# Patient Record
Sex: Male | Born: 1950
Health system: Southern US, Community
[De-identification: ages and names within clinical notes are randomized; demographics above are authoritative.]

## PROBLEM LIST (undated history)

## (undated) DIAGNOSIS — E785 Hyperlipidemia, unspecified: Secondary | ICD-10-CM

## (undated) DIAGNOSIS — J45909 Unspecified asthma, uncomplicated: Secondary | ICD-10-CM

## (undated) DIAGNOSIS — F17210 Nicotine dependence, cigarettes, uncomplicated: Secondary | ICD-10-CM

## (undated) DIAGNOSIS — J069 Acute upper respiratory infection, unspecified: Secondary | ICD-10-CM

## (undated) DIAGNOSIS — I10 Essential (primary) hypertension: Secondary | ICD-10-CM

## (undated) DIAGNOSIS — J449 Chronic obstructive pulmonary disease, unspecified: Secondary | ICD-10-CM

## (undated) DIAGNOSIS — T7840XA Allergy, unspecified, initial encounter: Secondary | ICD-10-CM

## (undated) DIAGNOSIS — H269 Unspecified cataract: Secondary | ICD-10-CM

## (undated) DIAGNOSIS — J0100 Acute maxillary sinusitis, unspecified: Secondary | ICD-10-CM

## (undated) DIAGNOSIS — M1990 Unspecified osteoarthritis, unspecified site: Secondary | ICD-10-CM

## (undated) HISTORY — DX: Allergy, unspecified, initial encounter: T78.40XA

## (undated) HISTORY — PX: CATARACT EXTRACTION W/ INTRAOCULAR LENS IMPLANT: SHX1309

## (undated) HISTORY — DX: Unspecified osteoarthritis, unspecified site: M19.90

## (undated) HISTORY — PX: SHOULDER ARTHROSCOPY W/ ACROMIAL REPAIR: SUR94

## (undated) HISTORY — DX: Chronic obstructive pulmonary disease, unspecified: J44.9

## (undated) HISTORY — DX: Unspecified asthma, uncomplicated: J45.909

## (undated) HISTORY — DX: Nicotine dependence, cigarettes, uncomplicated: F17.210

## (undated) HISTORY — PX: POLYPECTOMY: SHX149

## (undated) HISTORY — DX: Unspecified cataract: H26.9

## (undated) HISTORY — DX: Hyperlipidemia, unspecified: E78.5

## (undated) HISTORY — DX: Acute maxillary sinusitis, unspecified: J01.00

## (undated) HISTORY — DX: Acute upper respiratory infection, unspecified: J06.9

## (undated) HISTORY — DX: Essential (primary) hypertension: I10

---

## 1986-06-10 HISTORY — PX: ELBOW SURGERY: SHX618

## 1986-06-10 HISTORY — PX: OTHER SURGICAL HISTORY: SHX169

## 2000-03-18 ENCOUNTER — Emergency Department (HOSPITAL_COMMUNITY): Admission: EM | Admit: 2000-03-18 | Discharge: 2000-03-18 | Payer: Self-pay | Admitting: Emergency Medicine

## 2002-07-06 LAB — HM COLONOSCOPY

## 2006-02-27 ENCOUNTER — Ambulatory Visit: Payer: Self-pay | Admitting: Internal Medicine

## 2006-03-10 DIAGNOSIS — C4491 Basal cell carcinoma of skin, unspecified: Secondary | ICD-10-CM

## 2006-03-10 HISTORY — DX: Basal cell carcinoma of skin, unspecified: C44.91

## 2007-02-24 ENCOUNTER — Ambulatory Visit: Payer: Self-pay | Admitting: Internal Medicine

## 2007-02-24 DIAGNOSIS — J0100 Acute maxillary sinusitis, unspecified: Secondary | ICD-10-CM

## 2007-02-24 HISTORY — DX: Acute maxillary sinusitis, unspecified: J01.00

## 2007-05-28 ENCOUNTER — Ambulatory Visit: Payer: Self-pay | Admitting: Internal Medicine

## 2007-05-28 DIAGNOSIS — J069 Acute upper respiratory infection, unspecified: Secondary | ICD-10-CM

## 2007-05-28 HISTORY — DX: Acute upper respiratory infection, unspecified: J06.9

## 2007-07-20 ENCOUNTER — Emergency Department (HOSPITAL_COMMUNITY): Admission: EM | Admit: 2007-07-20 | Discharge: 2007-07-20 | Payer: Self-pay | Admitting: Emergency Medicine

## 2007-12-21 ENCOUNTER — Ambulatory Visit: Payer: Self-pay | Admitting: Internal Medicine

## 2007-12-21 LAB — CONVERTED CEMR LAB
AST: 26 units/L (ref 0–37)
Albumin: 4 g/dL (ref 3.5–5.2)
Alkaline Phosphatase: 56 units/L (ref 39–117)
BUN: 18 mg/dL (ref 6–23)
Bilirubin, Direct: 0.1 mg/dL (ref 0.0–0.3)
Chloride: 109 meq/L (ref 96–112)
Cholesterol: 238 mg/dL (ref 0–200)
Eosinophils Absolute: 0.2 10*3/uL (ref 0.0–0.7)
Eosinophils Relative: 3.5 % (ref 0.0–5.0)
GFR calc Af Amer: 111 mL/min
GFR calc non Af Amer: 92 mL/min
HDL: 64.5 mg/dL (ref >39.0)
MCV: 96.9 fL (ref 78.0–100.0)
Monocytes Absolute: 0.5 10*3/uL (ref 0.1–1.0)
Monocytes Relative: 7.9 % (ref 3.0–12.0)
Neutrophils Relative %: 61.2 % (ref 43.0–77.0)
Platelets: 193 10*3/uL (ref 150–400)
Potassium: 4.5 meq/L (ref 3.5–5.1)
RDW: 12.8 % (ref 11.5–14.6)
Sodium: 144 meq/L (ref 135–145)
TSH: 0.9 microintl units/mL (ref 0.35–5.50)
Total Bilirubin: 0.9 mg/dL (ref 0.3–1.2)
Total CHOL/HDL Ratio: 3.7
Triglycerides: 62 mg/dL (ref 0–149)
WBC: 6.8 10*3/uL (ref 4.5–10.5)

## 2007-12-22 ENCOUNTER — Ambulatory Visit: Payer: Self-pay | Admitting: Internal Medicine

## 2008-03-07 ENCOUNTER — Telehealth: Payer: Self-pay | Admitting: Internal Medicine

## 2008-08-11 ENCOUNTER — Telehealth: Payer: Self-pay | Admitting: Internal Medicine

## 2010-03-26 DIAGNOSIS — C4491 Basal cell carcinoma of skin, unspecified: Secondary | ICD-10-CM

## 2010-03-26 DIAGNOSIS — D229 Melanocytic nevi, unspecified: Secondary | ICD-10-CM

## 2010-03-26 HISTORY — DX: Basal cell carcinoma of skin, unspecified: C44.91

## 2010-03-26 HISTORY — DX: Melanocytic nevi, unspecified: D22.9

## 2010-12-05 ENCOUNTER — Ambulatory Visit (INDEPENDENT_AMBULATORY_CARE_PROVIDER_SITE_OTHER): Payer: BC Managed Care – PPO | Admitting: Family Medicine

## 2010-12-05 ENCOUNTER — Encounter: Payer: Self-pay | Admitting: Family Medicine

## 2010-12-05 VITALS — BP 178/98 | Temp 98.6°F | Ht 71.25 in | Wt 162.0 lb

## 2010-12-05 DIAGNOSIS — I10 Essential (primary) hypertension: Secondary | ICD-10-CM

## 2010-12-05 DIAGNOSIS — W57XXXA Bitten or stung by nonvenomous insect and other nonvenomous arthropods, initial encounter: Secondary | ICD-10-CM

## 2010-12-05 DIAGNOSIS — T148XXA Other injury of unspecified body region, initial encounter: Secondary | ICD-10-CM

## 2010-12-05 MED ORDER — LOSARTAN POTASSIUM 50 MG PO TABS: 50.0000 mg | ORAL_TABLET | Freq: Every day | ORAL | Status: DC

## 2010-12-05 NOTE — Progress Notes (Signed)
  Subjective:    Patient ID: Russell Cox, male    DOB: 02/02/1951, 60 y.o.   MRN: [id number]  HPI Patient seen for the following  Recent tick bite 2 days ago. By description, probable deer tick. Site of the bite left lower abdomen. Minimal surrounding erythema. No pain or itching. Question of retained tick part. Denies fever, chills, headache, or other rash. No arthralgias.  History of elevated blood pressure but several recent readings. Recently at work 160s systolic. Generally feels well no headaches or dizziness. No chest pains. No peripheral edema. No nonsteroidal use. No regular alcohol use. He has no history of CAD or peripheral vascular disease. Currently takes no regular medications   Review of Systems  Constitutional: Negative for fever, chills and fatigue.  Eyes: Negative for visual disturbance.  Respiratory: Negative for cough, chest tightness and shortness of breath.   Cardiovascular: Negative for chest pain, palpitations and leg swelling.  Skin: Positive for rash.  Neurological: Negative for dizziness, syncope, weakness, light-headedness and headaches.  Hematological: Negative for adenopathy. Does not bruise/bleed easily.       Objective:   Physical Exam  Constitutional: He is oriented to person, place, and time. He appears well-developed and well-nourished. No distress.  HENT:  Head: Normocephalic and atraumatic.  Right Ear: External ear normal.  Left Ear: External ear normal.  Mouth/Throat: Oropharynx is clear and moist.  Eyes: Conjunctivae and EOM are normal. Pupils are equal, round, and reactive to light.  Neck: Normal range of motion. Neck supple. No thyromegaly present.  Cardiovascular: Normal rate, regular rhythm and normal heart sounds.   No murmur heard. Pulmonary/Chest: No respiratory distress. He has no wheezes. He has no rales.  Musculoskeletal: He exhibits no edema.  Lymphadenopathy:    He has no cervical adenopathy.  Neurological: He is alert and  oriented to person, place, and time. He displays normal reflexes. No cranial nerve deficit.  Skin: Rash noted.       Oval shaped area of rash 0.5 x 1 cm left lower abdomen. They're edematous and minimally raised. A magnification no definite tick parts identified. No pustules  Psychiatric: He has a normal mood and affect.          Assessment & Plan:  #1 tick bite. Local rash probably allergy related. No evidence for erythema chronicum migrans. No definite tick parts retained. Observe for now #2 elevated blood pressure with repeat being 178/98 with other elevated readings over 160 systolic over recent weeks. Start losartan 50 mg daily and schedule followup with primary in one month to reassess

## 2011-03-01 LAB — CBC
HCT: 45.2
Hemoglobin: 16
MCHC: 35.5
MCV: 92
Platelets: 216
RBC: 4.92
RDW: 13.4
WBC: 7.4

## 2011-03-01 LAB — BASIC METABOLIC PANEL
BUN: 9
Chloride: 103
GFR calc Af Amer: >60
GFR calc non Af Amer: >60
Potassium: 4.1
Sodium: 136

## 2011-03-01 LAB — BASIC METABOLIC PANEL WITH GFR
CO2: 26
Calcium: 9.3
Creatinine, Ser: 0.95
Glucose, Bld: 117 — ABNORMAL HIGH

## 2011-03-01 LAB — POCT CARDIAC MARKERS
CKMB, poc: 1.3
CKMB, poc: 1.6
Myoglobin, poc: 58.8
Myoglobin, poc: 84.5
Operator id: 3067
Operator id: 4708
Troponin i, poc: <0.05
Troponin i, poc: <0.05

## 2011-03-01 LAB — PROTIME-INR
INR: 1
Prothrombin Time: 13.3

## 2011-03-01 LAB — DIFFERENTIAL
Basophils Absolute: 0.1
Basophils Relative: 1
Eosinophils Absolute: 0.2
Eosinophils Relative: 3
Lymphocytes Relative: 18
Lymphs Abs: 1.4
Monocytes Absolute: 0.6
Monocytes Relative: 8
Neutro Abs: 5.2
Neutrophils Relative %: 70

## 2011-03-01 LAB — APTT: aPTT: 28

## 2011-03-07 ENCOUNTER — Other Ambulatory Visit (INDEPENDENT_AMBULATORY_CARE_PROVIDER_SITE_OTHER): Payer: BC Managed Care – PPO

## 2011-03-07 DIAGNOSIS — Z0000 Encounter for general adult medical examination without abnormal findings: Secondary | ICD-10-CM

## 2011-03-07 LAB — CBC WITH DIFFERENTIAL/PLATELET
Basophils Absolute: 0 10*3/uL (ref 0.0–0.1)
Eosinophils Absolute: 0.3 10*3/uL (ref 0.0–0.7)
HCT: 47 % (ref 39.0–52.0)
Hemoglobin: 15.8 g/dL (ref 13.0–17.0)
Lymphs Abs: 1.6 10*3/uL (ref 0.7–4.0)
MCHC: 33.5 g/dL (ref 30.0–36.0)
Neutro Abs: 4.6 10*3/uL (ref 1.4–7.7)
Platelets: 221 10*3/uL (ref 150.0–400.0)
RDW: 13.6 % (ref 11.5–14.6)

## 2011-03-07 LAB — POCT URINALYSIS DIPSTICK
Bilirubin, UA: NEGATIVE
Blood, UA: NEGATIVE
Ketones, UA: NEGATIVE
Leukocytes, UA: NEGATIVE
Spec Grav, UA: 1.02
pH, UA: 7

## 2011-03-08 LAB — LIPID PANEL
Cholesterol: 271 mg/dL — ABNORMAL HIGH (ref 0–200)
HDL: 73.7 mg/dL (ref >39.00)
Triglycerides: 60 mg/dL (ref 0.0–149.0)
VLDL: 12 mg/dL (ref 0.0–40.0)

## 2011-03-08 LAB — BASIC METABOLIC PANEL
BUN: 11 mg/dL (ref 6–23)
CO2: 26 mEq/L (ref 19–32)
Calcium: 9.3 mg/dL (ref 8.4–10.5)
Glucose, Bld: 99 mg/dL (ref 70–99)
Potassium: 5.1 mEq/L (ref 3.5–5.1)
Sodium: 141 mEq/L (ref 135–145)

## 2011-03-08 LAB — HEPATIC FUNCTION PANEL: Albumin: 4.3 g/dL (ref 3.5–5.2)

## 2011-03-25 ENCOUNTER — Encounter: Payer: Self-pay | Admitting: Internal Medicine

## 2011-03-25 ENCOUNTER — Ambulatory Visit (INDEPENDENT_AMBULATORY_CARE_PROVIDER_SITE_OTHER): Payer: BC Managed Care – PPO | Admitting: Internal Medicine

## 2011-03-25 DIAGNOSIS — F17200 Nicotine dependence, unspecified, uncomplicated: Secondary | ICD-10-CM

## 2011-03-25 DIAGNOSIS — Z720 Tobacco use: Secondary | ICD-10-CM

## 2011-03-25 DIAGNOSIS — E785 Hyperlipidemia, unspecified: Secondary | ICD-10-CM

## 2011-03-25 DIAGNOSIS — I10 Essential (primary) hypertension: Secondary | ICD-10-CM

## 2011-03-25 DIAGNOSIS — Z0000 Encounter for general adult medical examination without abnormal findings: Secondary | ICD-10-CM

## 2011-03-25 DIAGNOSIS — Z23 Encounter for immunization: Secondary | ICD-10-CM

## 2011-03-25 MED ORDER — SILDENAFIL CITRATE 100 MG PO TABS: 100.0000 mg | ORAL_TABLET | ORAL | Status: DC | PRN

## 2011-03-25 MED ORDER — LOSARTAN POTASSIUM 50 MG PO TABS: 50.0000 mg | ORAL_TABLET | Freq: Every day | ORAL | Status: DC

## 2011-03-25 NOTE — Patient Instructions (Signed)
Limit your sodium (Salt) intake    It is important that you exercise regularly, at least 20 minutes 3 to 4 times per week.  If you develop chest pain or shortness of breath seek  medical attention.  Smoking tobacco is very bad for your health. You should stop smoking immediately.  Return in one year for follow-up  

## 2011-03-25 NOTE — Progress Notes (Signed)
Subjective:    Patient ID: Russell Cox, male    DOB: 08-21-1950, 60 y.o.   MRN: [id number]  HPI  60 year old patient who is in today for a preventive health examination. He has treated hypertension and a history of ongoing tobacco use he has cut his smoking consumption down to one half pack of cigarettes per daily recent lipid profile reviewed and risks versus benefits of statin therapy discussed. He is asymptomatic. Denies any cardiopulmonary complaints  Current Allergies:  ! PENICILLIN G POTASSIUM (PENICILLIN G POTASSIUM)   Past Medical History:  Reviewed history from 02/24/2007 and no changes required:  childhood asthma  ongoing tobacco use   Past Surgical History:  Reviewed history from 02/24/2007 and no changes required:  surgery for a tennis elbow in 1988  hemorrhoidectomy 88  colonoscopy. 2004   Family History:  Reviewed history from 02/24/2007 and no changes required:  father died age 74, congestive heart failure. Mother died age 27. One brother one sister, brother with hypertension and bladder cancer father may have had thyroid cancer  mother had cancer, unclear type   Social History:  Reviewed history from 02/24/2007 and no changes required:  Married  Current Smoker  Regular exercise-no  Risk Factors:  Counseled to quit/cut down tobacco use: yes  Exercise: no     Review of Systems  Constitutional: Negative for fever, chills, activity change, appetite change and fatigue.  HENT: Negative for hearing loss, ear pain, congestion, rhinorrhea, sneezing, mouth sores, trouble swallowing, neck pain, neck stiffness, dental problem, voice change, sinus pressure and tinnitus.   Eyes: Negative for photophobia, pain, redness and visual disturbance.  Respiratory: Negative for apnea, cough, choking, chest tightness, shortness of breath and wheezing.   Cardiovascular: Negative for chest pain, palpitations and leg swelling.  Gastrointestinal: Negative for nausea, vomiting, abdominal  pain, diarrhea, constipation, blood in stool, abdominal distention, anal bleeding and rectal pain.  Genitourinary: Negative for dysuria, urgency, frequency, hematuria, flank pain, decreased urine volume, discharge, penile swelling, scrotal swelling, difficulty urinating, genital sores and testicular pain.  Musculoskeletal: Negative for myalgias, back pain, joint swelling, arthralgias and gait problem.  Skin: Negative for color change, rash and wound.  Neurological: Negative for dizziness, tremors, seizures, syncope, facial asymmetry, speech difficulty, weakness, light-headedness, numbness and headaches.  Hematological: Negative for adenopathy. Does not bruise/bleed easily.  Psychiatric/Behavioral: Negative for suicidal ideas, hallucinations, behavioral problems, confusion, sleep disturbance, self-injury, dysphoric mood, decreased concentration and agitation. The patient is not nervous/anxious.        Objective:   Physical Exam  Constitutional: He appears well-developed and well-nourished.  HENT:  Head: Normocephalic and atraumatic.  Right Ear: External ear normal.  Left Ear: External ear normal.  Nose: Nose normal.  Mouth/Throat: Oropharynx is clear and moist.  Eyes: Conjunctivae and EOM are normal. Pupils are equal, round, and reactive to light. No scleral icterus.  Neck: Normal range of motion. Neck supple. No JVD present. No thyromegaly present.  Cardiovascular: Regular rhythm, normal heart sounds and intact distal pulses.  Exam reveals no gallop and no friction rub.   No murmur heard. Pulmonary/Chest: Effort normal and breath sounds normal. He exhibits no tenderness.  Abdominal: Soft. Bowel sounds are normal. He exhibits no distension and no mass. There is no tenderness.       Bilateral femoral bruits right greater than the left  Genitourinary: Prostate normal and penis normal.  Musculoskeletal: Normal range of motion. He exhibits no edema and no tenderness.  Lymphadenopathy:    He  has no  cervical adenopathy.  Neurological: He is alert. He has normal reflexes. No cranial nerve deficit. Coordination normal.  Skin: Skin is warm and dry. No rash noted.  Psychiatric: He has a normal mood and affect. His behavior is normal.          Assessment & Plan:   Hypertension well controlled Dyslipidemia with a high HDL. Statin therapy discussed Ongoing tobacco use  Total cessation of smoking encouraged;  recheck in one year  Patient wishes to try the diet and follow the lipid panel in 4 months. We'll reconsider statin therapy at that time

## 2011-07-22 ENCOUNTER — Other Ambulatory Visit (INDEPENDENT_AMBULATORY_CARE_PROVIDER_SITE_OTHER): Payer: BC Managed Care – PPO

## 2011-07-22 DIAGNOSIS — E785 Hyperlipidemia, unspecified: Secondary | ICD-10-CM

## 2011-07-22 NOTE — Progress Notes (Unsigned)
  Subjective:    Patient ID: Russell Cox, male    DOB: [birth year], 61 y.o.   MRN: [id number]  HPI    Review of Systems     Objective:   Physical Exam        Assessment & Plan:

## 2011-07-23 LAB — LIPID PANEL
HDL: 80.3 mg/dL (ref >39.00)
Triglycerides: 100 mg/dL (ref 0.0–149.0)
VLDL: 20 mg/dL (ref 0.0–40.0)

## 2011-07-23 LAB — LDL CHOLESTEROL, DIRECT: Direct LDL: 160.7 mg/dL

## 2011-07-24 NOTE — Progress Notes (Signed)
Quick Note:  spke with pt- informed of labs and dr. Vernon Prey instructions - will f/u in 6 mos at cpx ______

## 2011-07-29 ENCOUNTER — Ambulatory Visit: Payer: BC Managed Care – PPO | Admitting: Internal Medicine

## 2012-03-19 ENCOUNTER — Other Ambulatory Visit: Payer: BC Managed Care – PPO

## 2012-03-26 ENCOUNTER — Encounter: Payer: BC Managed Care – PPO | Admitting: Internal Medicine

## 2012-04-09 ENCOUNTER — Other Ambulatory Visit (INDEPENDENT_AMBULATORY_CARE_PROVIDER_SITE_OTHER): Payer: BC Managed Care – PPO

## 2012-04-09 DIAGNOSIS — Z0000 Encounter for general adult medical examination without abnormal findings: Secondary | ICD-10-CM

## 2012-04-09 LAB — LIPID PANEL
HDL: 57.2 mg/dL (ref >39.00)
Triglycerides: 103 mg/dL (ref 0.0–149.0)

## 2012-04-09 LAB — CBC WITH DIFFERENTIAL/PLATELET
Basophils Absolute: 0 10*3/uL (ref 0.0–0.1)
HCT: 45.3 % (ref 39.0–52.0)
Lymphs Abs: 1.7 10*3/uL (ref 0.7–4.0)
Monocytes Absolute: 0.7 10*3/uL (ref 0.1–1.0)
Monocytes Relative: 8.3 % (ref 3.0–12.0)
Platelets: 256 10*3/uL (ref 150.0–400.0)
RDW: 12.9 % (ref 11.5–14.6)

## 2012-04-09 LAB — POCT URINALYSIS DIPSTICK
Blood, UA: NEGATIVE
Ketones, UA: NEGATIVE
Protein, UA: NEGATIVE
Spec Grav, UA: 1.02
pH, UA: 6

## 2012-04-09 LAB — HEPATIC FUNCTION PANEL
AST: 22 U/L (ref 0–37)
Albumin: 3.6 g/dL (ref 3.5–5.2)
Alkaline Phosphatase: 60 U/L (ref 39–117)
Total Protein: 7 g/dL (ref 6.0–8.3)

## 2012-04-09 LAB — BASIC METABOLIC PANEL
CO2: 25 mEq/L (ref 19–32)
Calcium: 9.6 mg/dL (ref 8.4–10.5)
GFR: 89.47 mL/min (ref >60.00)
Sodium: 138 mEq/L (ref 135–145)

## 2012-04-20 ENCOUNTER — Encounter: Payer: Self-pay | Admitting: Internal Medicine

## 2012-04-20 ENCOUNTER — Ambulatory Visit (INDEPENDENT_AMBULATORY_CARE_PROVIDER_SITE_OTHER): Payer: BC Managed Care – PPO | Admitting: Internal Medicine

## 2012-04-20 VITALS — BP 150/82 | HR 95 | Temp 97.8°F | Resp 16 | Ht 71.75 in | Wt 168.0 lb

## 2012-04-20 DIAGNOSIS — Z720 Tobacco use: Secondary | ICD-10-CM

## 2012-04-20 DIAGNOSIS — E785 Hyperlipidemia, unspecified: Secondary | ICD-10-CM

## 2012-04-20 DIAGNOSIS — I10 Essential (primary) hypertension: Secondary | ICD-10-CM

## 2012-04-20 DIAGNOSIS — Z23 Encounter for immunization: Secondary | ICD-10-CM

## 2012-04-20 DIAGNOSIS — F17200 Nicotine dependence, unspecified, uncomplicated: Secondary | ICD-10-CM

## 2012-04-20 DIAGNOSIS — Z0000 Encounter for general adult medical examination without abnormal findings: Secondary | ICD-10-CM

## 2012-04-20 MED ORDER — SILDENAFIL CITRATE 100 MG PO TABS: 100.0000 mg | ORAL_TABLET | ORAL | Status: DC | PRN

## 2012-04-20 MED ORDER — LOSARTAN POTASSIUM 50 MG PO TABS: 50.0000 mg | ORAL_TABLET | Freq: Every day | ORAL | Status: DC

## 2012-04-20 MED ORDER — ATORVASTATIN CALCIUM 40 MG PO TABS: 40.0000 mg | ORAL_TABLET | Freq: Every day | ORAL | Status: DC

## 2012-04-20 NOTE — Patient Instructions (Addendum)
Smoking tobacco is very bad for your health. You should stop smoking immediately.    It is important that you exercise regularly, at least 20 minutes 3 to 4 times per week.  If you develop chest pain or shortness of breath seek  medical attention.  Please check your blood pressure on a regular basis.  If it is consistently greater than 150/90, please make an office appointment.  Return in one year for follow-up   Schedule your colonoscopy to help detect colon cancer.

## 2012-04-20 NOTE — Progress Notes (Signed)
Subjective:    Patient ID: Russell Cox, male    DOB: 1951-04-08, 61 y.o.   MRN: [id number]  HPI  61 year old patient who is seen today for a preventive health examination. Cardiovascular risk factors  include hypertension dyslipidemia and ongoing tobacco use.  Past Medical History  Diagnosis Date  . SINUSITIS, ACUTE MAXILLARY 02/24/2007  . URI 05/28/2007  . Cigarette smoker     History   Social History  . Marital Status: Married    Spouse Name: N/A    Number of Children: N/A  . Years of Education: N/A   Occupational History  . Not on file.   Social History Main Topics  . Smoking status: Current Every Day Smoker -- 1.0 packs/day for 40 years    Types: Cigarettes  . Smokeless tobacco: Never Used  . Alcohol Use: Not on file  . Drug Use: Not on file  . Sexually Active: Not on file   Other Topics Concern  . Not on file   Social History Narrative  . No narrative on file    Past Surgical History  Procedure Date  . Elbow surgery 1988    tennis  . Hemmorrhoidectomy 1988    Family History  Problem Relation Age of Onset  . Cancer Mother     unknown what type  . Heart disease Father   . Cancer Father     unclear, maybe thyroid  . Hypertension Sister   . Hypertension Brother     Allergies  Allergen Reactions  . Penicillins     Current Outpatient Prescriptions on File Prior to Visit  Medication Sig Dispense Refill  . losartan (COZAAR) 50 MG tablet Take 1 tablet (50 mg total) by mouth daily.  90 tablet  6  . sildenafil (VIAGRA) 100 MG tablet Take 1 tablet (100 mg total) by mouth as needed for erectile dysfunction.  20 tablet  1    BP 150/82  Pulse 95  Temp 97.8 F (36.6 C) (Oral)  Resp 16  Ht 5' 11.75" (1.822 m)  Wt 168 lb (76.204 kg)  BMI 22.94 kg/m2  SpO2 98%    Review of Systems  Constitutional: Negative for fever, chills, activity change, appetite change and fatigue.  HENT: Negative for hearing loss, ear pain, congestion, rhinorrhea, sneezing,  mouth sores, trouble swallowing, neck pain, neck stiffness, dental problem, voice change, sinus pressure and tinnitus.   Eyes: Negative for photophobia, pain, redness and visual disturbance.  Respiratory: Negative for apnea, cough, choking, chest tightness, shortness of breath and wheezing.   Cardiovascular: Negative for chest pain, palpitations and leg swelling.  Gastrointestinal: Negative for nausea, vomiting, abdominal pain, diarrhea, constipation, blood in stool, abdominal distention, anal bleeding and rectal pain.  Genitourinary: Negative for dysuria, urgency, frequency, hematuria, flank pain, decreased urine volume, discharge, penile swelling, scrotal swelling, difficulty urinating, genital sores and testicular pain.  Musculoskeletal: Negative for myalgias, back pain, joint swelling, arthralgias and gait problem.  Skin: Negative for color change, rash and wound.  Neurological: Negative for dizziness, tremors, seizures, syncope, facial asymmetry, speech difficulty, weakness, light-headedness, numbness and headaches.  Hematological: Negative for adenopathy. Does not bruise/bleed easily.  Psychiatric/Behavioral: Negative for suicidal ideas, hallucinations, behavioral problems, confusion, sleep disturbance, self-injury, dysphoric mood, decreased concentration and agitation. The patient is not nervous/anxious.        Objective:   Physical Exam  Constitutional: He appears well-developed and well-nourished.  HENT:  Head: Normocephalic and atraumatic.  Right Ear: External ear normal.  Left Ear: External ear normal.  Nose: Nose normal.  Mouth/Throat: Oropharynx is clear and moist.  Eyes: Conjunctivae normal and EOM are normal. Pupils are equal, round, and reactive to light. No scleral icterus.  Neck: Normal range of motion. Neck supple. No JVD present. No thyromegaly present.       Faint right supraclavicular bruit  Cardiovascular: Normal rate, regular rhythm, normal heart sounds and intact  distal pulses.  Exam reveals no gallop and no friction rub.   No murmur heard.      Bilateral femoral bruits  Pulmonary/Chest: Effort normal and breath sounds normal. He exhibits no tenderness.  Abdominal: Soft. Bowel sounds are normal. He exhibits no distension and no mass. There is no tenderness.  Genitourinary: Prostate normal and penis normal. Guaiac negative stool.  Musculoskeletal: Normal range of motion. He exhibits no edema and no tenderness.  Lymphadenopathy:    He has no cervical adenopathy.  Neurological: He is alert. He has normal reflexes. No cranial nerve deficit. Coordination normal.  Skin: Skin is warm and dry. No rash noted.  Psychiatric: He has a normal mood and affect. His behavior is normal.          Assessment & Plan:   Preventive health  HTN HLD- start atorvastain  F/u colonoscopy

## 2012-04-22 ENCOUNTER — Encounter: Payer: Self-pay | Admitting: Gastroenterology

## 2012-04-27 ENCOUNTER — Ambulatory Visit (AMBULATORY_SURGERY_CENTER): Payer: BC Managed Care – PPO

## 2012-04-27 VITALS — Ht 71.0 in | Wt 170.0 lb

## 2012-04-27 DIAGNOSIS — Z1211 Encounter for screening for malignant neoplasm of colon: Secondary | ICD-10-CM

## 2012-04-27 MED ORDER — MOVIPREP 100 G PO SOLR: 1.0000 | Freq: Once | ORAL | Status: DC

## 2012-05-18 ENCOUNTER — Encounter: Payer: Self-pay | Admitting: Gastroenterology

## 2012-05-18 ENCOUNTER — Ambulatory Visit (AMBULATORY_SURGERY_CENTER): Payer: BC Managed Care – PPO | Admitting: Gastroenterology

## 2012-05-18 VITALS — BP 130/71 | HR 69 | Temp 96.8°F | Resp 9 | Ht 71.0 in | Wt 170.0 lb

## 2012-05-18 DIAGNOSIS — D126 Benign neoplasm of colon, unspecified: Secondary | ICD-10-CM

## 2012-05-18 DIAGNOSIS — Z1211 Encounter for screening for malignant neoplasm of colon: Secondary | ICD-10-CM

## 2012-05-18 MED ORDER — SODIUM CHLORIDE 0.9 % IV SOLN: 500.0000 mL | INTRAVENOUS | Status: DC

## 2012-05-18 NOTE — Patient Instructions (Addendum)
YOU HAD AN ENDOSCOPIC PROCEDURE TODAY AT THE [facility]: Refer to the procedure report that was given to you for any specific questions about what was found during the examination.  If the procedure report does not answer your questions, please call your gastroenterologist to clarify.  If you requested that your care partner not be given the details of your procedure findings, then the procedure report has been included in a sealed envelope for you to review at your convenience later.  YOU SHOULD EXPECT: Some feelings of bloating in the abdomen. Passage of more gas than usual.  Walking can help get rid of the air that was put into your GI tract during the procedure and reduce the bloating. If you had a lower endoscopy (such as a colonoscopy or flexible sigmoidoscopy) you may notice spotting of blood in your stool or on the toilet paper. If you underwent a bowel prep for your procedure, then you may not have a normal bowel movement for a few days.  DIET: Your first meal following the procedure should be a light meal and then it is ok to progress to your normal diet.  A half-sandwich or bowl of soup is an example of a good first meal.  Heavy or fried foods are harder to digest and may make you feel nauseous or bloated.  Likewise meals heavy in dairy and vegetables can cause extra gas to form and this can also increase the bloating.  Drink plenty of fluids but you should avoid alcoholic beverages for 24 hours.  ACTIVITY: Your care partner should take you home directly after the procedure.  You should plan to take it easy, moving slowly for the rest of the day.  You can resume normal activity the day after the procedure however you should NOT DRIVE or use heavy machinery for 24 hours (because of the sedation medicines used during the test).    SYMPTOMS TO REPORT IMMEDIATELY: A gastroenterologist can be reached at any hour.  During normal business hours, 8:30 AM to 5:00 PM Monday through Friday,  call [phone].  After hours and on weekends, please call the GI answering service at [phone] who will take a message and have the physician on call contact you.   Following lower endoscopy (colonoscopy or flexible sigmoidoscopy):  Excessive amounts of blood in the stool  Significant tenderness or worsening of abdominal pains  Swelling of the abdomen that is new, acute  Fever of 100F or higher  FOLLOW UP: If any biopsies were taken you will be contacted by phone or by letter within the next 1-3 weeks.  Call your gastroenterologist if you have not heard about the biopsies in 3 weeks.  Our staff will call the home number listed on your records the next business day following your procedure to check on you and address any questions or concerns that you may have at that time regarding the information given to you following your procedure. This is a courtesy call and so if there is no answer at the home number and we have not heard from you through the emergency physician on call, we will assume that you have returned to your regular daily activities without incident.  SIGNATURES/CONFIDENTIALITY: You and/or your care partner have signed paperwork which will be entered into your electronic medical record.  These signatures attest to the fact that that the information above on your After Visit Summary has been reviewed and is understood.  Full responsibility of the confidentiality of this   discharge information lies with you and/or your care-partner.   Thank-you for choosing us for your healthcare needs. 

## 2012-05-18 NOTE — Progress Notes (Signed)
Patient did not have preoperative order for IV antibiotic SSI prophylaxis. (G8918)  Patient did not experience any of the following events: a burn prior to discharge; a fall within the facility; wrong site/side/patient/procedure/implant event; or a hospital transfer or hospital admission upon discharge from the facility. (G8907)  

## 2012-05-18 NOTE — Progress Notes (Signed)
Called to room to assist during endoscopic procedure.  Patient ID and intended procedure confirmed with present staff. Received instructions for my participation in the procedure from the performing physician.  

## 2012-05-18 NOTE — Op Note (Signed)
 [facility] [address] [place], [address]   COLONOSCOPY PROCEDURE REPORT  PATIENT: Russell Cox, Russell Cox  MR#: [id number] BIRTHDATE: Mar 11, 1951 , 60  yrs. old GENDER: Male ENDOSCOPIST: Meryl Dare, MD, [facility] PROCEDURE DATE:  05/18/2012 PROCEDURE:   Colonoscopy with snare polypectomy and Colonoscopy with biopsy ASA CLASS:   Class II INDICATIONS:average risk screening and mother with colon cancer at age 63. MEDICATIONS: MAC sedation, administered by CRNA and propofol (Diprivan) 350mg  IV DESCRIPTION OF PROCEDURE:   After the risks benefits and alternatives of the procedure were thoroughly explained, informed consent was obtained.  A digital rectal exam revealed no abnormalities of the rectum.   The LB [id number]  endoscope was introduced through the anus and advanced to the cecum, which was identified by both the appendix and ileocecal valve. No adverse events experienced.   The quality of the prep was excellent, using MoviPrep  The instrument was then slowly withdrawn as the colon was fully examined.  COLON FINDINGS: Four sessile polyps measuring 5-6 mm in size were found in the sigmoid colon and descending colon.  A polypectomy was performed with a cold snare.  The resection was complete and the polyp tissue was completely retrieved.   Four sessile polyps measuring 3-4 mm in size were found in the rectum.  A polypectomy was performed with cold forceps.  The resection was complete and the polyp tissue was completely retrieved. The colon was otherwise normal. There was no diverticulosis, inflammation, polyps or cancers unless previously stated.  Retroflexed views revealed no abnormalities. The time to cecum=3 minutes 00 seconds.  Withdrawal time=12 minutes 52 seconds.  The scope was withdrawn and the procedure completed.  COMPLICATIONS: There were no complications.  ENDOSCOPIC IMPRESSION: 1.   Four sessile polyps measuring 5-6 mm in the sigmoid  and descending colon; polypectomy performed with a cold snare 2.   Four sessile polyps measuring 3-4 mm in the rectum; polypectomy performed with cold forceps  RECOMMENDATIONS: 1.  Await pathology results 2.  Repeat Colonoscopy in 3 years if 3 or more polyps are adenomatous, 5 years if 1-2 adenomatous, otherwise 10 years.   eSigned:  Meryl Dare, MD, [facility] 05/18/2012 2:28 PM

## 2012-05-19 ENCOUNTER — Telehealth: Payer: Self-pay | Admitting: *Deleted

## 2012-05-19 NOTE — Telephone Encounter (Signed)
  Follow up Call-  Call back number 05/18/2012  Post procedure Call Back phone  # [phone]  Permission to leave phone message Yes     Patient questions:  Do you have a fever, pain , or abdominal swelling? no Pain Score  0 *  Have you tolerated food without any problems? yes  Have you been able to return to your normal activities? yes  Do you have any questions about your discharge instructions: Diet   no Medications  no Follow up visit  no  Do you have questions or concerns about your Care? no  Actions: * If pain score is 4 or above: No action needed, pain <4.

## 2012-05-24 ENCOUNTER — Encounter: Payer: Self-pay | Admitting: Gastroenterology

## 2012-07-29 ENCOUNTER — Other Ambulatory Visit: Payer: Self-pay | Admitting: Dermatology

## 2013-03-31 ENCOUNTER — Encounter: Payer: Self-pay | Admitting: Family Medicine

## 2013-03-31 ENCOUNTER — Ambulatory Visit (INDEPENDENT_AMBULATORY_CARE_PROVIDER_SITE_OTHER): Payer: 59 | Admitting: Family Medicine

## 2013-03-31 VITALS — BP 148/80 | HR 103 | Temp 97.9°F | Wt 158.0 lb

## 2013-03-31 DIAGNOSIS — M26609 Unspecified temporomandibular joint disorder, unspecified side: Secondary | ICD-10-CM

## 2013-03-31 NOTE — Progress Notes (Signed)
  Subjective:    Patient ID: Russell Cox, male    DOB: [birth year], 62 y.o.   MRN: [id number]  HPI Here for one month of intermittent pain in the left ear and left jaw. No pain on chewing. No sinus sx of ST. He admits to grinding his teeth at night.    Review of Systems  Constitutional: Negative.   HENT: Positive for ear pain. Negative for sinus pressure, sore throat and trouble swallowing.   Eyes: Negative.        Objective:   Physical Exam  Constitutional: He appears well-developed and well-nourished.  HENT:  Right Ear: External ear normal.  Left Ear: External ear normal.  Nose: Nose normal.  Mouth/Throat: Oropharynx is clear and moist.  Tender over the left TMJ. No crepitus, full ROM   Eyes: Conjunctivae are normal.  Lymphadenopathy:    He has no cervical adenopathy.          Assessment & Plan:  Advised him to see his dentist to make a mouth guard, etc.

## 2013-04-06 ENCOUNTER — Ambulatory Visit (INDEPENDENT_AMBULATORY_CARE_PROVIDER_SITE_OTHER): Payer: 59

## 2013-04-06 DIAGNOSIS — Z23 Encounter for immunization: Secondary | ICD-10-CM

## 2013-04-21 ENCOUNTER — Other Ambulatory Visit: Payer: Self-pay | Admitting: Internal Medicine

## 2013-04-29 ENCOUNTER — Other Ambulatory Visit: Payer: Self-pay | Admitting: Internal Medicine

## 2013-05-11 ENCOUNTER — Other Ambulatory Visit: Payer: Self-pay | Admitting: Internal Medicine

## 2013-06-06 ENCOUNTER — Other Ambulatory Visit: Payer: Self-pay | Admitting: Internal Medicine

## 2013-07-05 ENCOUNTER — Other Ambulatory Visit: Payer: 59

## 2013-07-10 ENCOUNTER — Other Ambulatory Visit: Payer: Self-pay | Admitting: Internal Medicine

## 2013-07-12 ENCOUNTER — Encounter: Payer: 59 | Admitting: Internal Medicine

## 2013-07-21 ENCOUNTER — Other Ambulatory Visit (INDEPENDENT_AMBULATORY_CARE_PROVIDER_SITE_OTHER): Payer: 59

## 2013-07-21 ENCOUNTER — Other Ambulatory Visit: Payer: Self-pay | Admitting: Internal Medicine

## 2013-07-21 DIAGNOSIS — Z0000 Encounter for general adult medical examination without abnormal findings: Secondary | ICD-10-CM

## 2013-07-21 LAB — POCT URINALYSIS DIPSTICK
Bilirubin, UA: NEGATIVE
Blood, UA: NEGATIVE
Glucose, UA: NEGATIVE
Ketones, UA: NEGATIVE
LEUKOCYTES UA: NEGATIVE
NITRITE UA: NEGATIVE
PH UA: 6
PROTEIN UA: NEGATIVE
Spec Grav, UA: 1.015
UROBILINOGEN UA: 0.2

## 2013-07-21 LAB — BASIC METABOLIC PANEL
BUN: 15 mg/dL (ref 6–23)
CALCIUM: 9.5 mg/dL (ref 8.4–10.5)
CO2: 26 meq/L (ref 19–32)
Chloride: 104 mEq/L (ref 96–112)
Creatinine, Ser: 1 mg/dL (ref 0.4–1.5)
GFR: 78.11 mL/min (ref >60.00)
GLUCOSE: 100 mg/dL — AB (ref 70–99)
POTASSIUM: 4.6 meq/L (ref 3.5–5.1)
SODIUM: 137 meq/L (ref 135–145)

## 2013-07-21 LAB — CBC WITH DIFFERENTIAL/PLATELET
BASOS PCT: 0.7 % (ref 0.0–3.0)
Basophils Absolute: 0.1 10*3/uL (ref 0.0–0.1)
Eosinophils Absolute: 0.6 10*3/uL (ref 0.0–0.7)
Eosinophils Relative: 7.3 % — ABNORMAL HIGH (ref 0.0–5.0)
HEMATOCRIT: 46.6 % (ref 39.0–52.0)
HEMOGLOBIN: 15.4 g/dL (ref 13.0–17.0)
LYMPHS ABS: 2.1 10*3/uL (ref 0.7–4.0)
LYMPHS PCT: 26.6 % (ref 12.0–46.0)
MCHC: 33 g/dL (ref 30.0–36.0)
MCV: 96.7 fl (ref 78.0–100.0)
Monocytes Absolute: 0.6 10*3/uL (ref 0.1–1.0)
Monocytes Relative: 7.3 % (ref 3.0–12.0)
NEUTROS ABS: 4.5 10*3/uL (ref 1.4–7.7)
Neutrophils Relative %: 58.1 % (ref 43.0–77.0)
Platelets: 220 10*3/uL (ref 150.0–400.0)
RBC: 4.82 Mil/uL (ref 4.22–5.81)
RDW: 13.4 % (ref 11.5–14.6)
WBC: 7.8 10*3/uL (ref 4.5–10.5)

## 2013-07-21 LAB — LIPID PANEL
CHOL/HDL RATIO: 2
Cholesterol: 166 mg/dL (ref 0–200)
HDL: 66.5 mg/dL (ref >39.00)
LDL CALC: 83 mg/dL (ref 0–99)
Triglycerides: 83 mg/dL (ref 0.0–149.0)
VLDL: 16.6 mg/dL (ref 0.0–40.0)

## 2013-07-21 LAB — TSH: TSH: 1.73 u[IU]/mL (ref 0.35–5.50)

## 2013-07-21 LAB — HEPATIC FUNCTION PANEL
ALT: 29 U/L (ref 0–53)
AST: 24 U/L (ref 0–37)
Albumin: 4 g/dL (ref 3.5–5.2)
Alkaline Phosphatase: 70 U/L (ref 39–117)
BILIRUBIN DIRECT: 0.1 mg/dL (ref 0.0–0.3)
Total Bilirubin: 0.6 mg/dL (ref 0.3–1.2)
Total Protein: 7.1 g/dL (ref 6.0–8.3)

## 2013-07-21 LAB — PSA: PSA: 0.12 ng/mL (ref 0.10–4.00)

## 2013-07-30 ENCOUNTER — Ambulatory Visit (INDEPENDENT_AMBULATORY_CARE_PROVIDER_SITE_OTHER): Payer: 59 | Admitting: Internal Medicine

## 2013-07-30 ENCOUNTER — Encounter: Payer: Self-pay | Admitting: Internal Medicine

## 2013-07-30 VITALS — BP 136/80 | HR 72 | Temp 98.4°F | Resp 18 | Ht 71.0 in | Wt 167.0 lb

## 2013-07-30 DIAGNOSIS — Z720 Tobacco use: Secondary | ICD-10-CM

## 2013-07-30 DIAGNOSIS — F17200 Nicotine dependence, unspecified, uncomplicated: Secondary | ICD-10-CM

## 2013-07-30 DIAGNOSIS — E785 Hyperlipidemia, unspecified: Secondary | ICD-10-CM

## 2013-07-30 DIAGNOSIS — I10 Essential (primary) hypertension: Secondary | ICD-10-CM

## 2013-07-30 DIAGNOSIS — Z0000 Encounter for general adult medical examination without abnormal findings: Secondary | ICD-10-CM

## 2013-07-30 NOTE — Progress Notes (Signed)
Subjective:    Patient ID: Russell Cox, male    DOB: 1951/03/25, 63 y.o.   MRN: [id number]  HPI 20 -year-old patient who is seen today for a preventive health examination. Cardiovascular risk factors  include hypertension dyslipidemia and ongoing tobacco use.  Current Allergies:  ! PENICILLIN G POTASSIUM (PENICILLIN G POTASSIUM)   Past Medical History:   childhood asthma  ongoing tobacco use  Dyslipidemia Colonic polyps   Past Surgical History:   surgery for a tennis elbow in 1988  hemorrhoidectomy 88  colonoscopy. 2004  2013  Family History:   father died age 30, congestive heart failure. Mother died age 80. One brother one sister, brother with hypertension and bladder cancer  father may have had thyroid cancer  mother had cancer, unclear type   Social History:   Married  Current Smoker  Regular exercise-no  Risk Factors:  Counseled to quit/cut down tobacco use: yes  Exercise: no       Past Medical History  Diagnosis Date  . SINUSITIS, ACUTE MAXILLARY 02/24/2007  . URI 05/28/2007  . Cigarette smoker     History   Social History  . Marital Status: Married    Spouse Name: N/A    Number of Children: N/A  . Years of Education: N/A   Occupational History  . Not on file.   Social History Main Topics  . Smoking status: Current Every Day Smoker -- 1.00 packs/day for 40 years    Types: Cigarettes  . Smokeless tobacco: Never Used  . Alcohol Use: Yes     Comment: socially  . Drug Use: No  . Sexual Activity: Not on file   Other Topics Concern  . Not on file   Social History Narrative  . No narrative on file    Past Surgical History  Procedure Laterality Date  . Elbow surgery  1988    tennis  . Hemmorrhoidectomy  1988  . Shoulder arthroscopy w/ acromial repair      left  . Cataract extraction w/ intraocular lens implant      left    Family History  Problem Relation Age of Onset  . Cancer Mother     unknown what type  . Colon cancer Mother    . Heart disease Father   . Cancer Father     unclear, maybe thyroid  . Hypertension Sister   . Hypertension Brother     Allergies  Allergen Reactions  . Penicillins Hives    Current Outpatient Prescriptions on File Prior to Visit  Medication Sig Dispense Refill  . atorvastatin (LIPITOR) 40 MG tablet TAKE 1 TABLET DAILY.  30 tablet  5  . losartan (COZAAR) 50 MG tablet TAKE 1 TABLET ONCE DAILY.  30 tablet  0  . VIAGRA 100 MG tablet TAKE 1 TABLET WHEN NEEDED FOR ERECTILE DYSFUNCTION.  3 tablet  2   No current facility-administered medications on file prior to visit.    BP 136/80  Pulse 72  Temp(Src) 98.4 F (36.9 C) (Oral)  Resp 18  Ht 5\' 11"  (1.803 m)  Wt 167 lb (75.751 kg)  BMI 23.30 kg/m2  SpO2 98%    Review of Systems  Constitutional: Negative for fever, chills, activity change, appetite change and fatigue.  HENT: Negative for congestion, dental problem, ear pain, hearing loss, mouth sores, rhinorrhea, sinus pressure, sneezing, tinnitus, trouble swallowing and voice change.   Eyes: Negative for photophobia, pain, redness and visual disturbance.  Respiratory: Negative for apnea, cough, choking, chest  tightness, shortness of breath and wheezing.   Cardiovascular: Negative for chest pain, palpitations and leg swelling.  Gastrointestinal: Negative for nausea, vomiting, abdominal pain, diarrhea, constipation, blood in stool, abdominal distention, anal bleeding and rectal pain.  Genitourinary: Negative for dysuria, urgency, frequency, hematuria, flank pain, decreased urine volume, discharge, penile swelling, scrotal swelling, difficulty urinating, genital sores and testicular pain.  Musculoskeletal: Negative for arthralgias, back pain, gait problem, joint swelling, myalgias, neck pain and neck stiffness.  Skin: Negative for color change, rash and wound.  Neurological: Negative for dizziness, tremors, seizures, syncope, facial asymmetry, speech difficulty, weakness,  light-headedness, numbness and headaches.  Hematological: Negative for adenopathy. Does not bruise/bleed easily.  Psychiatric/Behavioral: Negative for suicidal ideas, hallucinations, behavioral problems, confusion, sleep disturbance, self-injury, dysphoric mood, decreased concentration and agitation. The patient is not nervous/anxious.        Objective:   Physical Exam  Constitutional: He appears well-developed and well-nourished.  HENT:  Head: Normocephalic and atraumatic.  Right Ear: External ear normal.  Left Ear: External ear normal.  Nose: Nose normal.  Mouth/Throat: Oropharynx is clear and moist.  Eyes: Conjunctivae and EOM are normal. Pupils are equal, round, and reactive to light. No scleral icterus.  Neck: Normal range of motion. Neck supple. No JVD present. No thyromegaly present.  Faint right supraclavicular bruit  Cardiovascular: Normal rate, regular rhythm, normal heart sounds and intact distal pulses.  Exam reveals no gallop and no friction rub.   No murmur heard. Bilateral femoral bruits  Pulmonary/Chest: Effort normal and breath sounds normal. He exhibits no tenderness.  Abdominal: Soft. Bowel sounds are normal. He exhibits no distension and no mass. There is no tenderness.  Genitourinary: Prostate normal and penis normal. Guaiac negative stool.  Musculoskeletal: Normal range of motion. He exhibits no edema and no tenderness.  Lymphadenopathy:    He has no cervical adenopathy.  Neurological: He is alert. He has normal reflexes. No cranial nerve deficit. Coordination normal.  Skin: Skin is warm and dry. No rash noted.  Psychiatric: He has a normal mood and affect. His behavior is normal.          Assessment & Plan:   Preventive health  HTN HLD- continue atorvastatin Ongoing tobacco use.  Total smoking cessation encouraged  Recheck one year Colonic polyps

## 2013-07-30 NOTE — Progress Notes (Signed)
Pre-visit discussion using our clinic review tool. No additional management support is needed unless otherwise documented below in the visit note.  

## 2013-07-30 NOTE — Patient Instructions (Signed)
Limit your sodium (Salt) intake    It is important that you exercise regularly, at least 20 minutes 3 to 4 times per week.  If you develop chest pain or shortness of breath seek  medical attention.  Please check your blood pressure on a regular basis.  If it is consistently greater than 150/90, please make an office appointment.  Smoking tobacco is very bad for your health. You should stop smoking immediately.  Return in one year for follow-up

## 2013-08-02 ENCOUNTER — Telehealth: Payer: Self-pay | Admitting: Internal Medicine

## 2013-08-02 NOTE — Telephone Encounter (Signed)
Relevant patient education assigned to patient using Emmi. ° °

## 2013-08-18 ENCOUNTER — Other Ambulatory Visit: Payer: Self-pay | Admitting: Internal Medicine

## 2013-10-25 ENCOUNTER — Other Ambulatory Visit: Payer: Self-pay | Admitting: Dermatology

## 2014-01-10 ENCOUNTER — Ambulatory Visit (INDEPENDENT_AMBULATORY_CARE_PROVIDER_SITE_OTHER): Payer: 59 | Admitting: Family Medicine

## 2014-01-10 ENCOUNTER — Encounter: Payer: Self-pay | Admitting: Family Medicine

## 2014-01-10 ENCOUNTER — Telehealth: Payer: Self-pay | Admitting: Internal Medicine

## 2014-01-10 VITALS — BP 137/71 | HR 90 | Temp 99.0°F | Ht 71.0 in | Wt 155.0 lb

## 2014-01-10 DIAGNOSIS — B029 Zoster without complications: Secondary | ICD-10-CM

## 2014-01-10 MED ORDER — VALACYCLOVIR HCL 1 G PO TABS: 1000.0000 mg | ORAL_TABLET | Freq: Three times a day (TID) | ORAL | Status: DC

## 2014-01-10 NOTE — Telephone Encounter (Signed)
Relevant patient education assigned to patient using Emmi. ° °

## 2014-01-10 NOTE — Progress Notes (Signed)
Pre visit review using our clinic review tool, if applicable. No additional management support is needed unless otherwise documented below in the visit note. 

## 2014-01-10 NOTE — Progress Notes (Signed)
   Subjective:    Patient ID: Russell Cox, male    DOB: 28-Jan-1951, 63 y.o.   MRN: [id number]  HPI Here for one week of painful bumps over the right scalp area. He saw Urgent Care 10 days ago for redness in the right eye and was treated with Vigamox drops and a course of oral Doxycycline. His eye is now back to normal but the bumps are his concern. No fever.    Review of Systems  Constitutional: Negative.   HENT: Negative.   Eyes: Negative.   Respiratory: Negative.   Skin: Positive for rash.       Objective:   Physical Exam  Constitutional: He appears well-developed and well-nourished.  HENT:  Right Ear: External ear normal.  Left Ear: External ear normal.  Nose: Nose normal.  Mouth/Throat: Oropharynx is clear and moist.  Eyes: Conjunctivae and EOM are normal. Pupils are equal, round, and reactive to light. Right eye exhibits no discharge. Left eye exhibits no discharge.  Neck:  Single tender lymph node in the right posterior neck   Skin:  Clusters of red papules and vesicles in the right parietal and occipital scalp areas          Assessment & Plan:  Treat with Valtrex. Recheck prn

## 2014-03-02 ENCOUNTER — Other Ambulatory Visit: Payer: Self-pay | Admitting: Internal Medicine

## 2014-03-18 ENCOUNTER — Other Ambulatory Visit: Payer: Self-pay | Admitting: Internal Medicine

## 2014-05-25 ENCOUNTER — Other Ambulatory Visit: Payer: Self-pay | Admitting: Internal Medicine

## 2014-07-11 ENCOUNTER — Other Ambulatory Visit: Payer: Self-pay | Admitting: Family Medicine

## 2014-07-29 ENCOUNTER — Other Ambulatory Visit (INDEPENDENT_AMBULATORY_CARE_PROVIDER_SITE_OTHER): Payer: 59

## 2014-07-29 DIAGNOSIS — Z0000 Encounter for general adult medical examination without abnormal findings: Secondary | ICD-10-CM

## 2014-07-29 LAB — CBC WITH DIFFERENTIAL/PLATELET
Basophils Absolute: 0.1 10*3/uL (ref 0.0–0.1)
Basophils Relative: 0.7 % (ref 0.0–3.0)
EOS PCT: 5.4 % — AB (ref 0.0–5.0)
Eosinophils Absolute: 0.5 10*3/uL (ref 0.0–0.7)
HCT: 46.4 % (ref 39.0–52.0)
Hemoglobin: 15.8 g/dL (ref 13.0–17.0)
LYMPHS ABS: 2.3 10*3/uL (ref 0.7–4.0)
LYMPHS PCT: 27.3 % (ref 12.0–46.0)
MCHC: 34.1 g/dL (ref 30.0–36.0)
MCV: 94.8 fl (ref 78.0–100.0)
Monocytes Absolute: 0.7 10*3/uL (ref 0.1–1.0)
Monocytes Relative: 8.8 % (ref 3.0–12.0)
NEUTROS ABS: 4.8 10*3/uL (ref 1.4–7.7)
NEUTROS PCT: 57.8 % (ref 43.0–77.0)
PLATELETS: 209 10*3/uL (ref 150.0–400.0)
RBC: 4.89 Mil/uL (ref 4.22–5.81)
RDW: 13.7 % (ref 11.5–15.5)
WBC: 8.3 10*3/uL (ref 4.0–10.5)

## 2014-07-29 LAB — POCT URINALYSIS DIP (MANUAL ENTRY)
BILIRUBIN UA: NEGATIVE
BILIRUBIN UA: NEGATIVE
Glucose, UA: NEGATIVE
LEUKOCYTES UA: NEGATIVE
Nitrite, UA: NEGATIVE
PROTEIN UA: NEGATIVE
RBC UA: NEGATIVE
Spec Grav, UA: 1.015
Urobilinogen, UA: 0.2
pH, UA: 5.5

## 2014-07-29 LAB — HEPATIC FUNCTION PANEL
ALBUMIN: 4.2 g/dL (ref 3.5–5.2)
ALT: 18 U/L (ref 0–53)
AST: 20 U/L (ref 0–37)
Alkaline Phosphatase: 70 U/L (ref 39–117)
BILIRUBIN DIRECT: 0.1 mg/dL (ref 0.0–0.3)
TOTAL PROTEIN: 6.9 g/dL (ref 6.0–8.3)
Total Bilirubin: 0.3 mg/dL (ref 0.2–1.2)

## 2014-07-29 LAB — LIPID PANEL
CHOL/HDL RATIO: 2
Cholesterol: 163 mg/dL (ref 0–200)
HDL: 66 mg/dL (ref >39.00)
LDL Cholesterol: 77 mg/dL (ref 0–99)
NonHDL: 97
TRIGLYCERIDES: 100 mg/dL (ref 0.0–149.0)
VLDL: 20 mg/dL (ref 0.0–40.0)

## 2014-07-29 LAB — BASIC METABOLIC PANEL
BUN: 16 mg/dL (ref 6–23)
CALCIUM: 9.8 mg/dL (ref 8.4–10.5)
CO2: 27 meq/L (ref 19–32)
CREATININE: 1.04 mg/dL (ref 0.40–1.50)
Chloride: 106 mEq/L (ref 96–112)
GFR: 76.14 mL/min (ref >60.00)
Glucose, Bld: 107 mg/dL — ABNORMAL HIGH (ref 70–99)
Potassium: 5.1 mEq/L (ref 3.5–5.1)
Sodium: 138 mEq/L (ref 135–145)

## 2014-07-29 LAB — PSA: PSA: 0.15 ng/mL (ref 0.10–4.00)

## 2014-07-29 LAB — TSH: TSH: 2.41 u[IU]/mL (ref 0.35–4.50)

## 2014-08-05 ENCOUNTER — Encounter: Payer: Self-pay | Admitting: Internal Medicine

## 2014-08-05 ENCOUNTER — Ambulatory Visit (INDEPENDENT_AMBULATORY_CARE_PROVIDER_SITE_OTHER): Payer: 59 | Admitting: Internal Medicine

## 2014-08-05 DIAGNOSIS — Z860100 Personal history of colon polyps, unspecified: Secondary | ICD-10-CM | POA: Insufficient documentation

## 2014-08-05 DIAGNOSIS — Z23 Encounter for immunization: Secondary | ICD-10-CM

## 2014-08-05 DIAGNOSIS — I10 Essential (primary) hypertension: Secondary | ICD-10-CM

## 2014-08-05 DIAGNOSIS — E785 Hyperlipidemia, unspecified: Secondary | ICD-10-CM

## 2014-08-05 DIAGNOSIS — Z720 Tobacco use: Secondary | ICD-10-CM

## 2014-08-05 DIAGNOSIS — R7302 Impaired glucose tolerance (oral): Secondary | ICD-10-CM

## 2014-08-05 DIAGNOSIS — Z0000 Encounter for general adult medical examination without abnormal findings: Secondary | ICD-10-CM

## 2014-08-05 MED ORDER — LOSARTAN POTASSIUM 50 MG PO TABS: 50.0000 mg | ORAL_TABLET | Freq: Every day | ORAL | Status: DC

## 2014-08-05 MED ORDER — ATORVASTATIN CALCIUM 40 MG PO TABS: 40.0000 mg | ORAL_TABLET | Freq: Every day | ORAL | Status: DC

## 2014-08-05 NOTE — Progress Notes (Signed)
Pre visit review using our clinic review tool, if applicable. No additional management support is needed unless otherwise documented below in the visit note. 

## 2014-08-05 NOTE — Progress Notes (Signed)
Subjective:    Patient ID: Russell Cox, male    DOB: [birth year], 64 y.o.   MRN: [id number]  HPI 64 -year-old patient who is seen today for a preventive health examination. Cardiovascular risk factors  include hypertension dyslipidemia and ongoing tobacco use.  Current Allergies:  ! PENICILLIN G POTASSIUM (PENICILLIN G POTASSIUM)   Past Medical History:   childhood asthma  ongoing tobacco use  Dyslipidemia Colonic polyps   Past Surgical History:   surgery for a tennis elbow in 1988  hemorrhoidectomy 88  colonoscopy. 2004  2013  Family History:   father died age 66, congestive heart failure. Mother died age 40. One brother one sister;  brother with hypertension and bladder cancer DM2 father may have had thyroid cancer  mother had cancer, unclear type   Social History:   Married  Current Smoker  Regular exercise-no  Risk Factors:  Counseled to quit/cut down tobacco use: yes  Exercise: no       Past Medical History  Diagnosis Date  . SINUSITIS, ACUTE MAXILLARY 02/24/2007  . URI 05/28/2007  . Cigarette smoker     History   Social History  . Marital Status: Married    Spouse Name: N/A  . Number of Children: N/A  . Years of Education: N/A   Occupational History  . Not on file.   Social History Main Topics  . Smoking status: Current Every Day Smoker -- 1.00 packs/day for 40 years    Types: Cigarettes  . Smokeless tobacco: Never Used  . Alcohol Use: Yes     Comment: socially  . Drug Use: No  . Sexual Activity: Not on file   Other Topics Concern  . Not on file   Social History Narrative    Past Surgical History  Procedure Laterality Date  . Elbow surgery  1988    tennis  . Hemmorrhoidectomy  1988  . Shoulder arthroscopy w/ acromial repair      left  . Cataract extraction w/ intraocular lens implant      left    Family History  Problem Relation Age of Onset  . Cancer Mother     unknown what type  . Colon cancer Mother   . Heart disease  Father   . Cancer Father     unclear, maybe thyroid  . Hypertension Sister   . Hypertension Brother     Allergies  Allergen Reactions  . Penicillins Hives    Current Outpatient Prescriptions on File Prior to Visit  Medication Sig Dispense Refill  . atorvastatin (LIPITOR) 40 MG tablet TAKE 1 TABLET BY MOUTH ONCE DAILY 30 tablet 5  . losartan (COZAAR) 50 MG tablet TAKE 1 TABLET BY MOUTH ONCE DAILY 30 tablet 2  . VIAGRA 100 MG tablet TAKE 1 TABLET BY MOUTH WHEN NEEDED FOR ERECTILE DYSFUNCTION 3 tablet 0   No current facility-administered medications on file prior to visit.    BP 130/72 mmHg  Pulse 85  Temp(Src) 98.4 F (36.9 C) (Oral)  Resp 20  Ht 5\' 11"  (1.803 m)  Wt 168 lb (76.204 kg)  BMI 23.44 kg/m2  SpO2 97%    Review of Systems  Constitutional: Negative for fever, chills, activity change, appetite change and fatigue.  HENT: Negative for congestion, dental problem, ear pain, hearing loss, mouth sores, rhinorrhea, sinus pressure, sneezing, tinnitus, trouble swallowing and voice change.   Eyes: Negative for photophobia, pain, redness and visual disturbance.  Respiratory: Negative for apnea, cough, choking, chest tightness, shortness of breath  and wheezing.   Cardiovascular: Negative for chest pain, palpitations and leg swelling.  Gastrointestinal: Negative for nausea, vomiting, abdominal pain, diarrhea, constipation, blood in stool, abdominal distention, anal bleeding and rectal pain.  Genitourinary: Negative for dysuria, urgency, frequency, hematuria, flank pain, decreased urine volume, discharge, penile swelling, scrotal swelling, difficulty urinating, genital sores and testicular pain.  Musculoskeletal: Negative for myalgias, back pain, joint swelling, arthralgias, gait problem, neck pain and neck stiffness.  Skin: Negative for color change, rash and wound.  Neurological: Negative for dizziness, tremors, seizures, syncope, facial asymmetry, speech difficulty, weakness,  light-headedness, numbness and headaches.  Hematological: Negative for adenopathy. Does not bruise/bleed easily.  Psychiatric/Behavioral: Negative for suicidal ideas, hallucinations, behavioral problems, confusion, sleep disturbance, self-injury, dysphoric mood, decreased concentration and agitation. The patient is not nervous/anxious.        Objective:   Physical Exam  Constitutional: He appears well-developed and well-nourished.  HENT:  Head: Normocephalic and atraumatic.  Right Ear: External ear normal.  Left Ear: External ear normal.  Nose: Nose normal.  Mouth/Throat: Oropharynx is clear and moist.  Eyes: Conjunctivae and EOM are normal. Pupils are equal, round, and reactive to light. No scleral icterus.  Neck: Normal range of motion. Neck supple. No JVD present. No thyromegaly present.  Faint right supraclavicular bruit  Cardiovascular: Normal rate, regular rhythm, normal heart sounds and intact distal pulses.  Exam reveals no gallop and no friction rub.   No murmur heard. Bilateral femoral bruits  Pulmonary/Chest: Effort normal and breath sounds normal. He exhibits no tenderness.  Abdominal: Soft. Bowel sounds are normal. He exhibits no distension and no mass. There is no tenderness.  Genitourinary: Prostate normal and penis normal. Guaiac negative stool.  Musculoskeletal: Normal range of motion. He exhibits no edema or tenderness.  Lymphadenopathy:    He has no cervical adenopathy.  Neurological: He is alert. He has normal reflexes. No cranial nerve deficit. Coordination normal.  Skin: Skin is warm and dry. No rash noted.  Psychiatric: He has a normal mood and affect. His behavior is normal.          Assessment & Plan:   Preventive health  HTN HLD- continue atorvastatin Ongoing tobacco use.  Total smoking cessation encouraged  Recheck one year Colonic polyps

## 2014-08-05 NOTE — Patient Instructions (Addendum)
Limit your sodium (Salt) intake  Please check your blood pressure on a regular basis.  If it is consistently greater than 150/90, please make an office appointment.    It is important that you exercise regularly, at least 20 minutes 3 to 4 times per week.  If you develop chest pain or shortness of breath seek  medical attention  Smoking tobacco is very bad for your health. You should stop smoking immediately.  Consider screening chest CTHealth Maintenance A healthy lifestyle and preventative care can promote health and wellness.  Maintain regular health, dental, and eye exams.  Eat a healthy diet. Foods like vegetables, fruits, whole grains, low-fat dairy products, and lean protein foods contain the nutrients you need and are low in calories. Decrease your intake of foods high in solid fats, added sugars, and salt. Get information about a proper diet from your health care provider, if necessary.  Regular physical exercise is one of the most important things you can do for your health. Most adults should get at least 150 minutes of moderate-intensity exercise (any activity that increases your heart rate and causes you to sweat) each week. In addition, most adults need muscle-strengthening exercises on 2 or more days a week.   Maintain a healthy weight. The body mass index (BMI) is a screening tool to identify possible weight problems. It provides an estimate of body fat based on height and weight. Your health care provider can find your BMI and can help you achieve or maintain a healthy weight. For males 20 years and older:  A BMI below 18.5 is considered underweight.  A BMI of 18.5 to 24.9 is normal.  A BMI of 25 to 29.9 is considered overweight.  A BMI of 30 and above is considered obese.  Maintain normal blood lipids and cholesterol by exercising and minimizing your intake of saturated fat. Eat a balanced diet with plenty of fruits and vegetables. Blood tests for lipids and cholesterol  should begin at age 81 and be repeated every 5 years. If your lipid or cholesterol levels are high, you are over age 27, or you are at high risk for heart disease, you may need your cholesterol levels checked more frequently.Ongoing high lipid and cholesterol levels should be treated with medicines if diet and exercise are not working.  If you smoke, find out from your health care provider how to quit. If you do not use tobacco, do not start.  Lung cancer screening is recommended for adults aged 76-80 years who are at high risk for developing lung cancer because of a history of smoking. A yearly low-dose CT scan of the lungs is recommended for people who have at least a 30-pack-year history of smoking and are current smokers or have quit within the past 15 years. A pack year of smoking is smoking an average of 1 pack of cigarettes a day for 1 year (for example, a 30-pack-year history of smoking could mean smoking 1 pack a day for 30 years or 2 packs a day for 15 years). Yearly screening should continue until the smoker has stopped smoking for at least 15 years. Yearly screening should be stopped for people who develop a health problem that would prevent them from having lung cancer treatment.  If you choose to drink alcohol, do not have more than 2 drinks per day. One drink is considered to be 12 oz (360 mL) of beer, 5 oz (150 mL) of wine, or 1.5 oz (45 mL) of liquor.  Avoid the use of street drugs. Do not share needles with anyone. Ask for help if you need support or instructions about stopping the use of drugs.  High blood pressure causes heart disease and increases the risk of stroke. Blood pressure should be checked at least every 1-2 years. Ongoing high blood pressure should be treated with medicines if weight loss and exercise are not effective.  If you are 74-56 years old, ask your health care provider if you should take aspirin to prevent heart disease.  Diabetes screening involves taking a  blood sample to check your fasting blood sugar level. This should be done once every 3 years after age 53 if you are at a normal weight and without risk factors for diabetes. Testing should be considered at a younger age or be carried out more frequently if you are overweight and have at least 1 risk factor for diabetes.  Colorectal cancer can be detected and often prevented. Most routine colorectal cancer screening begins at the age of 22 and continues through age 60. However, your health care provider may recommend screening at an earlier age if you have risk factors for colon cancer. On a yearly basis, your health care provider may provide home test kits to check for hidden blood in the stool. A small camera at the end of a tube may be used to directly examine the colon (sigmoidoscopy or colonoscopy) to detect the earliest forms of colorectal cancer. Talk to your health care provider about this at age 13 when routine screening begins. A direct exam of the colon should be repeated every 5-10 years through age 70, unless early forms of precancerous polyps or small growths are found.  People who are at an increased risk for hepatitis B should be screened for this virus. You are considered at high risk for hepatitis B if:  You were born in a country where hepatitis B occurs often. Talk with your health care provider about which countries are considered high risk.  Your parents were born in a high-risk country and you have not received a shot to protect against hepatitis B (hepatitis B vaccine).  You have HIV or AIDS.  You use needles to inject street drugs.  You live with, or have sex with, someone who has hepatitis B.  You are a man who has sex with other men (MSM).  You get hemodialysis treatment.  You take certain medicines for conditions like cancer, organ transplantation, and autoimmune conditions.  Hepatitis C blood testing is recommended for all people born from [birth year] through 1967 and any  individual with known risk factors for hepatitis C.  Healthy men should no longer receive prostate-specific antigen (PSA) blood tests as part of routine cancer screening. Talk to your health care provider about prostate cancer screening.  Testicular cancer screening is not recommended for adolescents or adult males who have no symptoms. Screening includes self-exam, a health care provider exam, and other screening tests. Consult with your health care provider about any symptoms you have or any concerns you have about testicular cancer.  Practice safe sex. Use condoms and avoid high-risk sexual practices to reduce the spread of sexually transmitted infections (STIs).  You should be screened for STIs, including gonorrhea and chlamydia if:  You are sexually active and are younger than 24 years.  You are older than 24 years, and your health care provider tells you that you are at risk for this type of infection.  Your sexual activity has changed since you  were last screened, and you are at an increased risk for chlamydia or gonorrhea. Ask your health care provider if you are at risk.  If you are at risk of being infected with HIV, it is recommended that you take a prescription medicine daily to prevent HIV infection. This is called pre-exposure prophylaxis (PrEP). You are considered at risk if:  You are a man who has sex with other men (MSM).  You are a heterosexual man who is sexually active with multiple partners.  You take drugs by injection.  You are sexually active with a partner who has HIV.  Talk with your health care provider about whether you are at high risk of being infected with HIV. If you choose to begin PrEP, you should first be tested for HIV. You should then be tested every 3 months for as long as you are taking PrEP.  Use sunscreen. Apply sunscreen liberally and repeatedly throughout the day. You should seek shade when your shadow is shorter than you. Protect yourself by  wearing long sleeves, pants, a wide-brimmed hat, and sunglasses year round whenever you are outdoors.  Tell your health care provider of new moles or changes in moles, especially if there is a change in shape or color. Also, tell your health care provider if a mole is larger than the size of a pencil eraser.  A one-time screening for abdominal aortic aneurysm (AAA) and surgical repair of large AAAs by ultrasound is recommended for men aged 43-75 years who are current or former smokers.  Stay current with your vaccines (immunizations). Document Released: 11/23/2007 Document Revised: 06/01/2013 Document Reviewed: 10/22/2010 [facility] Patient Information 2015 [place], [place]. This information is not intended to replace advice given to you by your health care provider. Make sure you discuss any questions you have with your health care provider.

## 2014-08-12 ENCOUNTER — Ambulatory Visit (INDEPENDENT_AMBULATORY_CARE_PROVIDER_SITE_OTHER)
Admission: RE | Admit: 2014-08-12 | Discharge: 2014-08-12 | Disposition: A | Discharge status: Home / Self Care | Payer: 59 | Source: Ambulatory Visit | Attending: Internal Medicine | Admitting: Internal Medicine

## 2014-08-12 DIAGNOSIS — Z720 Tobacco use: Secondary | ICD-10-CM

## 2014-08-12 DIAGNOSIS — Z0000 Encounter for general adult medical examination without abnormal findings: Secondary | ICD-10-CM

## 2015-02-03 ENCOUNTER — Other Ambulatory Visit: Payer: Self-pay | Admitting: *Deleted

## 2015-02-03 MED ORDER — SILDENAFIL CITRATE 100 MG PO TABS: ORAL_TABLET | ORAL | Status: DC

## 2015-03-21 ENCOUNTER — Other Ambulatory Visit: Payer: Self-pay | Admitting: Dermatology

## 2015-04-21 ENCOUNTER — Other Ambulatory Visit: Payer: Self-pay | Admitting: *Deleted

## 2015-04-21 MED ORDER — SILDENAFIL CITRATE 100 MG PO TABS: ORAL_TABLET | ORAL | Status: DC

## 2015-06-01 ENCOUNTER — Encounter: Payer: Self-pay | Admitting: Gastroenterology

## 2015-06-16 MED FILL — LOSARTAN POTASSIUM 50 MG TA: 50 | 90 days supply | Qty: 90 | Fill #2

## 2015-06-20 MED FILL — CLINDAMYCIN HCL 150 MG CAPS: 150 | 7 days supply | Qty: 28 | Fill #0

## 2015-06-29 MED FILL — CLINDAMYCIN HCL 150 MG CAPS: 150 | 7 days supply | Qty: 28 | Fill #0

## 2015-09-21 ENCOUNTER — Other Ambulatory Visit: Payer: Self-pay | Admitting: Internal Medicine

## 2015-09-21 MED FILL — ATORVASTATIN 40 MG TABLET: 40 | 90 days supply | Qty: 90 | Fill #0

## 2015-10-04 ENCOUNTER — Other Ambulatory Visit: Payer: Self-pay | Admitting: Internal Medicine

## 2015-10-05 MED FILL — LOSARTAN POTASSIUM 50 MG TA: 50 | 90 days supply | Qty: 90 | Fill #0

## 2015-12-20 ENCOUNTER — Ambulatory Visit (INDEPENDENT_AMBULATORY_CARE_PROVIDER_SITE_OTHER): Payer: 59 | Admitting: Internal Medicine

## 2015-12-20 ENCOUNTER — Encounter: Payer: Self-pay | Admitting: Internal Medicine

## 2015-12-20 VITALS — BP 128/60 | HR 92 | Temp 98.5°F | Resp 20 | Ht 71.0 in | Wt 163.0 lb

## 2015-12-20 DIAGNOSIS — W57XXXA Bitten or stung by nonvenomous insect and other nonvenomous arthropods, initial encounter: Secondary | ICD-10-CM | POA: Diagnosis not present

## 2015-12-20 DIAGNOSIS — T148 Other injury of unspecified body region: Secondary | ICD-10-CM

## 2015-12-20 DIAGNOSIS — I10 Essential (primary) hypertension: Secondary | ICD-10-CM

## 2015-12-20 MED ORDER — EPINEPHRINE 0.3 MG/0.3ML IJ SOAJ: 0.3000 mg | Freq: Once | INTRAMUSCULAR | Status: DC

## 2015-12-20 MED ORDER — METHYLPREDNISOLONE ACETATE 80 MG/ML IJ SUSP
80.0000 mg | Freq: Once | INTRAMUSCULAR | Status: AC
  Administered 2015-12-20: 80 mg via INTRAMUSCULAR

## 2015-12-20 MED FILL — EPINEPHRINE 0.3 MG AUTO-INJ: 0.3 | 30 days supply | Qty: 2 | Fill #0

## 2015-12-20 NOTE — Patient Instructions (Signed)
Anaphylactic Reaction An anaphylactic reaction is a sudden, severe allergic reaction. It affects the whole body. It can be life threatening. You may need to stay in the hospital.  [place] a medical bracelet or necklace that lists your allergy.  Carry your allergy kit or medicine shot to treat severe allergic reactions with you. These can save your life.  Do not drive until medicine from your shot has worn off, unless your doctor says it is okay.  If you have hives or a rash:  Take medicine as told by your doctor.  You may take over-the-counter antihistamine medicine.  Place cold cloths on your skin. Take baths in cool water. Avoid hot baths and hot showers. GET HELP RIGHT AWAY IF:   Your mouth is puffy (swollen), or you have trouble breathing.  You start making whistling sounds when you breathe (wheezing).  You have a tight feeling in your chest or throat.  You have a rash, hives, puffiness, or itching on your body.  You throw up (vomit) or have watery poop (diarrhea).  You feel dizzy or pass out (faint).  You think you are having an allergic reaction.  You have new symptoms. This is an emergency. Use your medicine shot or allergy kit as told. Call your local emergency services (911 in [place] Even if you feel better after the shot, you need to go to the hospital emergency department. MAKE SURE YOU:   Understand these instructions.  Will watch your condition.  Will get help right away if you are not doing well or get worse.   This information is not intended to replace advice given to you by your health care provider. Make sure you discuss any questions you have with your health care provider.   Document Released: 11/13/2007 Document Revised: 11/26/2011 Document Reviewed: 12/07/2014 Elsevier Interactive Patient Education [employer].

## 2015-12-20 NOTE — Progress Notes (Signed)
Pre visit review using our clinic review tool, if applicable. No additional management support is needed unless otherwise documented below in the visit note. 

## 2015-12-20 NOTE — Progress Notes (Signed)
Subjective:    Patient ID: Russell Cox, male    DOB: Mar 08, 1951, 65 y.o.   MRN: [id number]  HPI  65 year old patient who has a history of essential hypertension.  He also has a history of bee sting allergy.  Late yesterday evening he was bit on the dorsal aspect of the right hand.  He has had persistent and worsening swelling involving the right hand and wrist.  He has had a prior history of a significant and delayed reaction to a bee sting involving his foot in the past delayed treatment resulted in considerable swelling and pain.  He also describes worsening generalized itching. No wheezing or angioedema.  No hives  Past Medical History  Diagnosis Date  . SINUSITIS, ACUTE MAXILLARY 02/24/2007  . URI 05/28/2007  . Cigarette smoker      Social History   Social History  . Marital Status: Married    Spouse Name: N/A  . Number of Children: N/A  . Years of Education: N/A   Occupational History  . Not on file.   Social History Main Topics  . Smoking status: Current Every Day Smoker -- 1.00 packs/day for 40 years    Types: Cigarettes  . Smokeless tobacco: Never Used  . Alcohol Use: Yes     Comment: socially  . Drug Use: No  . Sexual Activity: Not on file   Other Topics Concern  . Not on file   Social History Narrative    Past Surgical History  Procedure Laterality Date  . Elbow surgery  1988    tennis  . Hemmorrhoidectomy  1988  . Shoulder arthroscopy w/ acromial repair      left  . Cataract extraction w/ intraocular lens implant      left    Family History  Problem Relation Age of Onset  . Cancer Mother     unknown what type  . Colon cancer Mother   . Heart disease Father   . Cancer Father     unclear, maybe thyroid  . Hypertension Sister   . Hypertension Brother     Allergies  Allergen Reactions  . Penicillins Hives    Current Outpatient Prescriptions on File Prior to Visit  Medication Sig Dispense Refill  . atorvastatin (LIPITOR) 40 MG tablet TAKE  1 TABLET BY MOUTH ONCE DAILY 90 tablet 0  . losartan (COZAAR) 50 MG tablet TAKE 1 TABLET BY MOUTH ONCE DAILY 90 tablet 0  . sildenafil (VIAGRA) 100 MG tablet TAKE 1 TABLET BY MOUTH WHEN NEEDED FOR ERECTILE DYSFUNCTION 3 tablet 1   No current facility-administered medications on file prior to visit.    BP 128/60 mmHg  Pulse 92  Temp(Src) 98.5 F (36.9 C) (Oral)  Resp 20  Ht 5\' 11"  (1.803 m)  Wt 163 lb (73.936 kg)  BMI 22.74 kg/m2  SpO2 97%     Review of Systems  Musculoskeletal:       Prominent swelling of the right hand  Skin: Positive for wound.       Objective:   Physical Exam  Constitutional: He is oriented to person, place, and time. He appears well-developed and well-nourished. No distress.  Deeply tanned Blood pressure well controlled  No distress  HENT:  Head: Normocephalic.  Right Ear: External ear normal.  Left Ear: External ear normal.  Eyes: Conjunctivae and EOM are normal.  Neck: Normal range of motion.  Cardiovascular: Normal rate, regular rhythm and normal heart sounds.   Pulmonary/Chest: Breath sounds normal. He  has no wheezes.  Abdominal: Bowel sounds are normal.  Musculoskeletal: Normal range of motion. He exhibits edema. He exhibits no tenderness.  Prominent soft tissue swelling of the right hand  Neurological: He is alert and oriented to person, place, and time.  Psychiatric: He has a normal mood and affect. His behavior is normal.          Assessment & Plan:   Bee sting allergy.  Will treat with Depo-Medrol 80 mg IM.  We'll continue with Benadryl as needed.  He'll report any clinical worsening.  Essential hypertension  Schedule a follow-up annual exam at his convenience  Nyoka Cowden, MD

## 2015-12-26 DIAGNOSIS — H5203 Hypermetropia, bilateral: Secondary | ICD-10-CM | POA: Diagnosis not present

## 2016-01-08 ENCOUNTER — Other Ambulatory Visit: Payer: Self-pay | Admitting: Internal Medicine

## 2016-01-08 MED FILL — ATORVASTATIN 40 MG TABLET: 40 | 90 days supply | Qty: 90 | Fill #0

## 2016-01-08 NOTE — Telephone Encounter (Signed)
Refill sent to pharmacy.   

## 2016-01-19 ENCOUNTER — Other Ambulatory Visit: Payer: Self-pay | Admitting: Internal Medicine

## 2016-01-19 MED FILL — LOSARTAN POTASSIUM 50 MG TA: 50 | 90 days supply | Qty: 90 | Fill #0

## 2016-01-29 ENCOUNTER — Encounter: Payer: Self-pay | Admitting: Gastroenterology

## 2016-03-05 ENCOUNTER — Ambulatory Visit (AMBULATORY_SURGERY_CENTER): Payer: Self-pay | Admitting: *Deleted

## 2016-03-05 VITALS — Ht 71.0 in | Wt 169.8 lb

## 2016-03-05 DIAGNOSIS — Z86010 Personal history of colonic polyps: Secondary | ICD-10-CM

## 2016-03-05 MED ORDER — NA SULFATE-K SULFATE-MG SULF 17.5-3.13-1.6 GM/177ML PO SOLN: 1.0000 | Freq: Once | ORAL | 0 refills | Status: AC

## 2016-03-05 MED FILL — SUPREP BOWEL PREP KIT: 17.5-3.13-1 | 2 days supply | Qty: 354 | Fill #0

## 2016-03-05 NOTE — Progress Notes (Signed)
No egg or soy allergy known to patient  No issues with past sedation with any surgeries  or procedures, no intubation problems  No diet pills per patient No home 02 use per patient  No blood thinners per patient  Pt denies issues with constipation  No A fib or A flutter   

## 2016-03-10 HISTORY — PX: COLONOSCOPY: SHX174

## 2016-03-19 ENCOUNTER — Encounter: Payer: Self-pay | Admitting: Gastroenterology

## 2016-03-19 ENCOUNTER — Ambulatory Visit (AMBULATORY_SURGERY_CENTER): Payer: 59 | Admitting: Gastroenterology

## 2016-03-19 VITALS — BP 126/73 | HR 67 | Temp 98.6°F | Resp 13 | Ht 71.0 in | Wt 169.0 lb

## 2016-03-19 DIAGNOSIS — D124 Benign neoplasm of descending colon: Secondary | ICD-10-CM | POA: Diagnosis not present

## 2016-03-19 DIAGNOSIS — D125 Benign neoplasm of sigmoid colon: Secondary | ICD-10-CM

## 2016-03-19 DIAGNOSIS — D123 Benign neoplasm of transverse colon: Secondary | ICD-10-CM | POA: Diagnosis not present

## 2016-03-19 DIAGNOSIS — I10 Essential (primary) hypertension: Secondary | ICD-10-CM | POA: Diagnosis not present

## 2016-03-19 DIAGNOSIS — Z86010 Personal history of colonic polyps: Secondary | ICD-10-CM | POA: Diagnosis present

## 2016-03-19 MED ORDER — SODIUM CHLORIDE 0.9 % IV SOLN: 500.0000 mL | INTRAVENOUS | Status: DC

## 2016-03-19 NOTE — Patient Instructions (Signed)
YOU HAD AN ENDOSCOPIC PROCEDURE TODAY AT THE [facility]:   Refer to the procedure report that was given to you for any specific questions about what was found during the examination.  If the procedure report does not answer your questions, please call your gastroenterologist to clarify.  If you requested that your care partner not be given the details of your procedure findings, then the procedure report has been included in a sealed envelope for you to review at your convenience later.  YOU SHOULD EXPECT: Some feelings of bloating in the abdomen. Passage of more gas than usual.  Walking can help get rid of the air that was put into your GI tract during the procedure and reduce the bloating. If you had a lower endoscopy (such as a colonoscopy or flexible sigmoidoscopy) you may notice spotting of blood in your stool or on the toilet paper. If you underwent a bowel prep for your procedure, you may not have a normal bowel movement for a few days.  Please Note:  You might notice some irritation and congestion in your nose or some drainage.  This is from the oxygen used during your procedure.  There is no need for concern and it should clear up in a day or so.  SYMPTOMS TO REPORT IMMEDIATELY:   Following lower endoscopy (colonoscopy or flexible sigmoidoscopy):  Excessive amounts of blood in the stool  Significant tenderness or worsening of abdominal pains  Swelling of the abdomen that is new, acute  Fever of 100F or higher    For urgent or emergent issues, a gastroenterologist can be reached at any hour by calling [phone].   DIET:  We do recommend a small meal at first, but then you may proceed to your regular diet.  Drink plenty of fluids but you should avoid alcoholic beverages for 24 hours.  ACTIVITY:  You should plan to take it easy for the rest of today and you should NOT DRIVE or use heavy machinery until tomorrow (because of the sedation medicines used during the test).     FOLLOW UP: Our staff will call the number listed on your records the next business day following your procedure to check on you and address any questions or concerns that you may have regarding the information given to you following your procedure. If we do not reach you, we will leave a message.  However, if you are feeling well and you are not experiencing any problems, there is no need to return our call.  We will assume that you have returned to your regular daily activities without incident.  If any biopsies were taken you will be contacted by phone or by letter within the next 1-3 weeks.  Please call us at [phone] if you have not heard about the biopsies in 3 weeks.    SIGNATURES/CONFIDENTIALITY: You and/or your care partner have signed paperwork which will be entered into your electronic medical record.  These signatures attest to the fact that that the information above on your After Visit Summary has been reviewed and is understood.  Full responsibility of the confidentiality of this discharge information lies with you and/or your care-partner.   Resume medications. Information given on polyps. 

## 2016-03-19 NOTE — Progress Notes (Signed)
Called to room to assist during endoscopic procedure.  Patient ID and intended procedure confirmed with present staff. Received instructions for my participation in the procedure from the performing physician.  

## 2016-03-19 NOTE — Progress Notes (Signed)
Abdomen soft non distended denies pain,taken to bathroom to allow time to pass air,pt. Stated he could not pass air,feels fine and was ready to go made doctor aware and received okay to d/c pt.

## 2016-03-19 NOTE — Progress Notes (Signed)
Report to PACU, RN, vss, BBS= Clear.  

## 2016-03-19 NOTE — Op Note (Signed)
[facility] Patient Name: Russell Cox Procedure Date: 03/19/2016 1:33 PM MRN: [id number] Endoscopist: Ladene Artist , MD Age: 65 Referring MD:  Date of Birth: July 12, 1950 Gender: Male Account #: [id number] Procedure:                Colonoscopy Indications:              Surveillance: Personal history of adenomatous                            polyps on last colonoscopy > 3 years ago Medicines:                Monitored Anesthesia Care Procedure:                Pre-Anesthesia Assessment:                           - Prior to the procedure, a History and Physical                            was performed, and patient medications and                            allergies were reviewed. The patient's tolerance of                            previous anesthesia was also reviewed. The risks                            and benefits of the procedure and the sedation                            options and risks were discussed with the patient.                            All questions were answered, and informed consent                            was obtained. Prior Anticoagulants: The patient has                            taken no previous anticoagulant or antiplatelet                            agents. ASA Grade Assessment: II - A patient with                            mild systemic disease. After reviewing the risks                            and benefits, the patient was deemed in                            satisfactory condition to undergo the procedure.  After obtaining informed consent, the colonoscope                            was passed under direct vision. Throughout the                            procedure, the patient's blood pressure, pulse, and                            oxygen saturations were monitored continuously. The                            Model PCF-[id number]) scope was introduced                            through the anus and  advanced to the the cecum,                            identified by appendiceal orifice and ileocecal                            valve. The ileocecal valve, appendiceal orifice,                            and rectum were photographed. The quality of the                            bowel preparation was good. The colonoscopy was                            performed without difficulty. The patient tolerated                            the procedure well. Scope In: 1:42:52 PM Scope Out: 2:05:44 PM Scope Withdrawal Time: 0 hours 19 minutes 50 seconds  Total Procedure Duration: 0 hours 22 minutes 52 seconds  Findings:                 The perianal and digital rectal examinations were                            normal.                           Seven sessile polyps were found in the sigmoid                            colon (6)and descending colon (1). The polyps were                            5 to 8 mm in size. These polyps were removed with a                            cold snare. Resection and retrieval were complete.  Two sessile polyps were found in the sigmoid colon                            and transverse colon. The polyps were 5 mm in size.                            These polyps were removed with a cold biopsy                            forceps. Resection and retrieval were complete.                           The exam was otherwise without abnormality on                            direct and retroflexion views. Complications:            No immediate complications. Estimated blood loss:                            None. Estimated Blood Loss:     Estimated blood loss: none. Impression:               - Seven 5 to 8 mm polyps in the sigmoid colon and                            in the descending colon, removed with a cold snare.                            Resected and retrieved.                           - Two 5 mm polyps in the sigmoid colon and in the                             transverse colon, removed with a cold biopsy                            forceps. Resected and retrieved.                           - The examination was otherwise normal on direct                            and retroflexion views. Recommendation:           - Repeat colonoscopy in 3 years for surveillance if                            3 or more polyps are precancerous, otherwise 5                            years.                           -  Patient has a contact number available for                            emergencies. The signs and symptoms of potential                            delayed complications were discussed with the                            patient. Return to normal activities tomorrow.                            Written discharge instructions were provided to the                            patient.                           - Resume previous diet.                           - Continue present medications.                           - Await pathology results. Ladene Artist, MD 03/19/2016 2:10:05 PM This report has been signed electronically.

## 2016-03-20 ENCOUNTER — Telehealth: Payer: Self-pay

## 2016-03-20 NOTE — Telephone Encounter (Signed)
  Follow up Call-  Call back number 03/19/2016  Post procedure Call Back phone  # [phone]  Permission to leave phone message Yes  Some recent data might be hidden     Patient questions:  Do you have a fever, pain , or abdominal swelling? No. Pain Score  0 *  Have you tolerated food without any problems? Yes.    Have you been able to return to your normal activities? Yes.    Do you have any questions about your discharge instructions: Diet   No. Medications  No. Follow up visit  No.  Do you have questions or concerns about your Care? No.  Actions: * If pain score is 4 or above: No action needed, pain <4.

## 2016-03-20 NOTE — Telephone Encounter (Signed)
  Follow up Call-  Call back number 03/19/2016  Post procedure Call Back phone  # [phone]  Permission to leave phone message Yes  Some recent data might be hidden    Patient was called for follow up after his procedure on 03/19/2016. No answer at the number given for follow up phone call. A message was left on the answering machine.

## 2016-04-02 ENCOUNTER — Encounter: Payer: Self-pay | Admitting: Gastroenterology

## 2016-04-22 ENCOUNTER — Other Ambulatory Visit: Payer: Self-pay | Admitting: Internal Medicine

## 2016-04-22 MED FILL — LOSARTAN POTASSIUM 50 MG TA: 50 | 90 days supply | Qty: 90 | Fill #0

## 2016-04-22 MED FILL — ATORVASTATIN 40 MG TABLET: 40 | 90 days supply | Qty: 90 | Fill #0

## 2016-07-10 ENCOUNTER — Other Ambulatory Visit: Payer: Self-pay | Admitting: Dermatology

## 2016-07-10 DIAGNOSIS — D229 Melanocytic nevi, unspecified: Secondary | ICD-10-CM | POA: Diagnosis not present

## 2016-07-10 DIAGNOSIS — L988 Other specified disorders of the skin and subcutaneous tissue: Secondary | ICD-10-CM | POA: Diagnosis not present

## 2016-07-10 DIAGNOSIS — D492 Neoplasm of unspecified behavior of bone, soft tissue, and skin: Secondary | ICD-10-CM | POA: Diagnosis not present

## 2016-08-01 ENCOUNTER — Other Ambulatory Visit: Payer: Self-pay | Admitting: Internal Medicine

## 2016-08-01 MED FILL — LOSARTAN POTASSIUM 50 MG TA: 50 | 90 days supply | Qty: 90 | Fill #0

## 2016-08-01 MED FILL — ATORVASTATIN 40 MG TABLET: 40 | 90 days supply | Qty: 90 | Fill #0

## 2016-11-07 ENCOUNTER — Other Ambulatory Visit: Payer: Self-pay | Admitting: Internal Medicine

## 2016-11-13 MED FILL — ATORVASTATIN 40 MG TABLET: 40 | 90 days supply | Qty: 90 | Fill #0

## 2016-11-13 MED FILL — LOSARTAN POTASSIUM 50 MG TA: 50 | 90 days supply | Qty: 90 | Fill #0

## 2016-11-19 DIAGNOSIS — J209 Acute bronchitis, unspecified: Secondary | ICD-10-CM | POA: Diagnosis not present

## 2016-11-19 DIAGNOSIS — R05 Cough: Secondary | ICD-10-CM | POA: Diagnosis not present

## 2016-11-19 DIAGNOSIS — J449 Chronic obstructive pulmonary disease, unspecified: Secondary | ICD-10-CM | POA: Diagnosis not present

## 2016-11-19 MED FILL — AZITHROMYCIN 250 MG TAB: 250 | 5 days supply | Qty: 6 | Fill #0

## 2016-11-19 MED FILL — GUAIATUSSIN AC LIQUID: 100-10 | 10 days supply | Qty: 100 | Fill #0

## 2017-01-31 ENCOUNTER — Encounter: Payer: 59 | Admitting: Internal Medicine

## 2017-02-17 ENCOUNTER — Other Ambulatory Visit: Payer: Self-pay | Admitting: Internal Medicine

## 2017-02-17 MED FILL — ATORVASTATIN 40 MG TABLET: 40 | 90 days supply | Qty: 90 | Fill #0

## 2017-02-17 MED FILL — LOSARTAN POTASSIUM 50 MG TA: 50 | 90 days supply | Qty: 90 | Fill #0

## 2017-03-27 DIAGNOSIS — Z961 Presence of intraocular lens: Secondary | ICD-10-CM | POA: Diagnosis not present

## 2017-04-15 ENCOUNTER — Ambulatory Visit (INDEPENDENT_AMBULATORY_CARE_PROVIDER_SITE_OTHER): Payer: 59 | Admitting: Internal Medicine

## 2017-04-15 ENCOUNTER — Encounter: Payer: Self-pay | Admitting: Internal Medicine

## 2017-04-15 VITALS — BP 146/78 | HR 73 | Temp 98.3°F | Ht 71.0 in | Wt 171.4 lb

## 2017-04-15 DIAGNOSIS — Z23 Encounter for immunization: Secondary | ICD-10-CM

## 2017-04-15 DIAGNOSIS — Z0000 Encounter for general adult medical examination without abnormal findings: Secondary | ICD-10-CM | POA: Diagnosis not present

## 2017-04-15 LAB — CBC WITH DIFFERENTIAL/PLATELET
BASOS PCT: 1.1 % (ref 0.0–3.0)
Basophils Absolute: 0.1 10*3/uL (ref 0.0–0.1)
EOS PCT: 3.8 % (ref 0.0–5.0)
Eosinophils Absolute: 0.2 10*3/uL (ref 0.0–0.7)
HCT: 47.5 % (ref 39.0–52.0)
Hemoglobin: 16 g/dL (ref 13.0–17.0)
LYMPHS PCT: 27.7 % (ref 12.0–46.0)
Lymphs Abs: 1.6 10*3/uL (ref 0.7–4.0)
MCHC: 33.6 g/dL (ref 30.0–36.0)
MCV: 99.1 fl (ref 78.0–100.0)
MONO ABS: 0.5 10*3/uL (ref 0.1–1.0)
Monocytes Relative: 9 % (ref 3.0–12.0)
NEUTROS PCT: 58.4 % (ref 43.0–77.0)
Neutro Abs: 3.3 10*3/uL (ref 1.4–7.7)
PLATELETS: 191 10*3/uL (ref 150.0–400.0)
RBC: 4.79 Mil/uL (ref 4.22–5.81)
RDW: 13.2 % (ref 11.5–15.5)
WBC: 5.7 10*3/uL (ref 4.0–10.5)

## 2017-04-15 LAB — COMPREHENSIVE METABOLIC PANEL
ALBUMIN: 4.4 g/dL (ref 3.5–5.2)
ALK PHOS: 62 U/L (ref 39–117)
ALT: 22 U/L (ref 0–53)
AST: 20 U/L (ref 0–37)
BUN: 13 mg/dL (ref 6–23)
CALCIUM: 9.9 mg/dL (ref 8.4–10.5)
CHLORIDE: 101 meq/L (ref 96–112)
CO2: 29 mEq/L (ref 19–32)
Creatinine, Ser: 1.11 mg/dL (ref 0.40–1.50)
GFR: 70.04 mL/min (ref >60.00)
Glucose, Bld: 114 mg/dL — ABNORMAL HIGH (ref 70–99)
Potassium: 4.5 mEq/L (ref 3.5–5.1)
Sodium: 137 mEq/L (ref 135–145)
TOTAL PROTEIN: 7.1 g/dL (ref 6.0–8.3)
Total Bilirubin: 0.6 mg/dL (ref 0.2–1.2)

## 2017-04-15 LAB — PSA: PSA: 0.15 ng/mL (ref 0.10–4.00)

## 2017-04-15 LAB — LIPID PANEL
Cholesterol: 190 mg/dL (ref 0–200)
HDL: 67.9 mg/dL (ref >39.00)
LDL CALC: 100 mg/dL — AB (ref 0–99)
NonHDL: 121.76
TRIGLYCERIDES: 107 mg/dL (ref 0.0–149.0)
Total CHOL/HDL Ratio: 3
VLDL: 21.4 mg/dL (ref 0.0–40.0)

## 2017-04-15 LAB — TSH: TSH: 3.28 u[IU]/mL (ref 0.35–4.50)

## 2017-04-15 MED ORDER — ASPIRIN 81 MG PO TABS: 81.0000 mg | ORAL_TABLET | Freq: Every day | ORAL | Status: AC

## 2017-04-15 NOTE — Patient Instructions (Addendum)
Limit your sodium (Salt) intake  Please check your blood pressure on a regular basis.  If it is consistently greater than 150/90, please make an office appointment.    It is important that you exercise regularly, at least 20 minutes 3 to 4 times per week.  If you develop chest pain or shortness of breath seek  medical attention.  Return in one year for follow-up    Health Maintenance, Male A healthy lifestyle and preventive care is important for your health and wellness. Ask your health care provider about what schedule of regular examinations is right for you. What should I know about weight and diet? Eat a Healthy Diet  Eat plenty of vegetables, fruits, whole grains, low-fat dairy products, and lean protein.  Do not eat a lot of foods high in solid fats, added sugars, or salt.  Maintain a Healthy Weight Regular exercise can help you achieve or maintain a healthy weight. You should:  Do at least 150 minutes of exercise each week. The exercise should increase your heart rate and make you sweat (moderate-intensity exercise).  Do strength-training exercises at least twice a week.  Watch Your Levels of Cholesterol and Blood Lipids  Have your blood tested for lipids and cholesterol every 5 years starting at 68 years of age. If you are at high risk for heart disease, you should start having your blood tested when you are 68 years old. You may need to have your cholesterol levels checked more often if: ? Your lipid or cholesterol levels are high. ? You are older than 68 years of age. ? You are at high risk for heart disease.  What should I know about cancer screening? Many types of cancers can be detected early and may often be prevented. Lung Cancer  You should be screened every year for lung cancer if: ? You are a current smoker who has smoked for at least 30 years. ? You are a former smoker who has quit within the past 15 years.  Talk to your health care provider about your  screening options, when you should start screening, and how often you should be screened.  Colorectal Cancer  Routine colorectal cancer screening usually begins at 68 years of age and should be repeated every 5-10 years until you are 67 years old. You may need to be screened more often if early forms of precancerous polyps or small growths are found. Your health care provider may recommend screening at an earlier age if you have risk factors for colon cancer.  Your health care provider may recommend using home test kits to check for hidden blood in the stool.  A small camera at the end of a tube can be used to examine your colon (sigmoidoscopy or colonoscopy). This checks for the earliest forms of colorectal cancer.  Prostate and Testicular Cancer  Depending on your age and overall health, your health care provider may do certain tests to screen for prostate and testicular cancer.  Talk to your health care provider about any symptoms or concerns you have about testicular or prostate cancer.  Skin Cancer  Check your skin from head to toe regularly.  Tell your health care provider about any new moles or changes in moles, especially if: ? There is a change in a mole's size, shape, or color. ? You have a mole that is larger than a pencil eraser.  Always use sunscreen. Apply sunscreen liberally and repeat throughout the day.  Protect yourself by wearing long sleeves,  pants, a wide-brimmed hat, and sunglasses when outside.  What should I know about heart disease, diabetes, and high blood pressure?  If you are 29-64 years of age, have your blood pressure checked every 3-5 years. If you are 59 years of age or older, have your blood pressure checked every year. You should have your blood pressure measured twice-once when you are at a hospital or clinic, and once when you are not at a hospital or clinic. Record the average of the two measurements. To check your blood pressure when you are not at  a hospital or clinic, you can use: ? An automated blood pressure machine at a pharmacy. ? A home blood pressure monitor.  Talk to your health care provider about your target blood pressure.  If you are between 31-57 years old, ask your health care provider if you should take aspirin to prevent heart disease.  Have regular diabetes screenings by checking your fasting blood sugar level. ? If you are at a normal weight and have a low risk for diabetes, have this test once every three years after the age of 66. ? If you are overweight and have a high risk for diabetes, consider being tested at a younger age or more often.  A one-time screening for abdominal aortic aneurysm (AAA) by ultrasound is recommended for men aged 21-75 years who are current or former smokers. What should I know about preventing infection? Hepatitis B If you have a higher risk for hepatitis B, you should be screened for this virus. Talk with your health care provider to find out if you are at risk for hepatitis B infection. Hepatitis C Blood testing is recommended for:  Everyone born from [birth year] through 1967.  Anyone with known risk factors for hepatitis C.  Sexually Transmitted Diseases (STDs)  You should be screened each year for STDs including gonorrhea and chlamydia if: ? You are sexually active and are younger than 68 years of age. ? You are older than 68 years of age and your health care provider tells you that you are at risk for this type of infection. ? Your sexual activity has changed since you were last screened and you are at an increased risk for chlamydia or gonorrhea. Ask your health care provider if you are at risk.  Talk with your health care provider about whether you are at high risk of being infected with HIV. Your health care provider may recommend a prescription medicine to help prevent HIV infection.  What else can I do?  Schedule regular health, dental, and eye exams.  Stay current with  your vaccines (immunizations).  Do not use any tobacco products, such as cigarettes, chewing tobacco, and e-cigarettes. If you need help quitting, ask your health care provider.  Limit alcohol intake to no more than 2 drinks per day. One drink equals 12 ounces of beer, 5 ounces of wine, or 1 ounces of hard liquor.  Do not use street drugs.  Do not share needles.  Ask your health care provider for help if you need support or information about quitting drugs.  Tell your health care provider if you often feel depressed.  Tell your health care provider if you have ever been abused or do not feel safe at home. This information is not intended to replace advice given to you by your health care provider. Make sure you discuss any questions you have with your health care provider. Document Released: 11/23/2007 Document Revised: 01/24/2016 Document Reviewed: 02/28/2015 Elsevier  Interactive Patient Education  [employer].

## 2017-04-16 ENCOUNTER — Encounter: Payer: Self-pay | Admitting: Internal Medicine

## 2017-04-16 LAB — HEPATITIS C ANTIBODY
Hepatitis C Ab: NONREACTIVE
SIGNAL TO CUT-OFF: 0.01 (ref <1.00)

## 2017-04-16 NOTE — Progress Notes (Signed)
Subjective:    Patient ID: Russell Cox, male    DOB: 1951-03-20, 66 y.o.   MRN: [id number]  HPI 66 year old patient who is seen today for preventive health examination. He continues to do well.  He has a history of hypertension and ongoing tobacco use.  Denies any cardiopulmonary complaints. He has a history of colonic polyps and last colonoscopy was 13 months ago  Past Medical History:   childhood asthma  ongoing tobacco use  Dyslipidemia Colonic polyps   Past Surgical History:   surgery for a tennis elbow in 1988  hemorrhoidectomy 88  colonoscopy. 2004  2013  Family History:   father died age 46, congestive heart failure. Mother died age 71. One brother one sister;  brother with hypertension and bladder cancer DM2 father may have had thyroid cancer  mother had cancer, unclear type   Social History:   Married  Current Smoker  Regular exercise-no  Risk Factors:  Counseled to quit/cut down tobacco use: yes  Exercise: no    Past Medical History:  Diagnosis Date  . Cataract    removed bilaterally  . Cigarette smoker   . COPD (chronic obstructive pulmonary disease) ([place])   . Hyperlipidemia   . Hypertension   . SINUSITIS, ACUTE MAXILLARY 02/24/2007  . URI 05/28/2007     Social History   Socioeconomic History  . Marital status: Married    Spouse name: Not on file  . Number of children: Not on file  . Years of education: Not on file  . Highest education level: Not on file  Social Needs  . Financial resource strain: Not on file  . Food insecurity - worry: Not on file  . Food insecurity - inability: Not on file  . Transportation needs - medical: Not on file  . Transportation needs - non-medical: Not on file  Occupational History  . Not on file  Tobacco Use  . Smoking status: Current Every Day Smoker    Packs/day: 1.00    Years: 40.00    Pack years: 40.00    Types: Cigarettes  . Smokeless tobacco: Never Used  Substance and Sexual Activity    . Alcohol use: Yes    Comment: socially  . Drug use: No  . Sexual activity: Not on file  Other Topics Concern  . Not on file  Social History Narrative  . Not on file    Past Surgical History:  Procedure Laterality Date  . CATARACT EXTRACTION W/ INTRAOCULAR LENS IMPLANT Bilateral    left  . COLONOSCOPY    . ELBOW SURGERY  1988   tennis  . hemmorrhoidectomy  1988  . POLYPECTOMY    . SHOULDER ARTHROSCOPY W/ ACROMIAL REPAIR     left    Family History  Problem Relation Age of Onset  . Cancer Mother        unknown what type  . Colon cancer Mother   . Heart disease Father   . Cancer Father        unclear, maybe thyroid  . Hypertension Sister   . Hypertension Brother   . Colon polyps Neg Hx   . Esophageal cancer Neg Hx   . Rectal cancer Neg Hx   . Stomach cancer Neg Hx     Allergies  Allergen Reactions  . Bee Venom Anaphylaxis  . Penicillins Hives    Current Outpatient Medications on File Prior to Visit  Medication Sig Dispense Refill  . atorvastatin (LIPITOR) 40 MG tablet TAKE 1 TABLET  BY MOUTH ONCE DAILY *NEED TO SCHEDULE PHYSICAL* 90 tablet 0  . EPINEPHrine 0.3 mg/0.3 mL IJ SOAJ injection Inject 0.3 mLs (0.3 mg total) into the muscle once. 1 Device 3  . losartan (COZAAR) 50 MG tablet TAKE 1 TABLET BY MOUTH ONCE DAILY (NEED PHYSICAL) 90 tablet 0  . sildenafil (VIAGRA) 100 MG tablet TAKE 1 TABLET BY MOUTH WHEN NEEDED FOR ERECTILE DYSFUNCTION 3 tablet 1   Current Facility-Administered Medications on File Prior to Visit  Medication Dose Route Frequency Provider Last Rate Last Dose  . 0.9 %  sodium chloride infusion  500 mL Intravenous Continuous Ladene Artist, MD        BP (!) 146/78 (BP Location: Left Arm, Patient Position: Sitting, Cuff Size: Normal)   Pulse 73   Temp 98.3 F (36.8 C) (Oral)   Ht 5\' 11"  (1.803 m)   Wt 171 lb 6.4 oz (77.7 kg)   SpO2 97%   BMI 23.91 kg/m      Review of Systems  Constitutional: Negative for appetite change, chills,  fatigue and fever.  HENT: Negative for congestion, dental problem, ear pain, hearing loss, sore throat, tinnitus, trouble swallowing and voice change.   Eyes: Negative for pain, discharge and visual disturbance.  Respiratory: Negative for cough, chest tightness, wheezing and stridor.   Cardiovascular: Negative for chest pain, palpitations and leg swelling.  Gastrointestinal: Negative for abdominal distention, abdominal pain, blood in stool, constipation, diarrhea, nausea and vomiting.  Genitourinary: Negative for difficulty urinating, discharge, flank pain, genital sores, hematuria and urgency.  Musculoskeletal: Negative for arthralgias, back pain, gait problem, joint swelling, myalgias and neck stiffness.  Skin: Negative for rash.  Neurological: Negative for dizziness, syncope, speech difficulty, weakness, numbness and headaches.  Hematological: Negative for adenopathy. Does not bruise/bleed easily.  Psychiatric/Behavioral: Negative for behavioral problems and dysphoric mood. The patient is not nervous/anxious.        Objective:   Physical Exam  Constitutional: He appears well-developed and well-nourished.  HENT:  Head: Normocephalic and atraumatic.  Right Ear: External ear normal.  Left Ear: External ear normal.  Nose: Nose normal.  Mouth/Throat: Oropharynx is clear and moist.  Eyes: Conjunctivae and EOM are normal. Pupils are equal, round, and reactive to light. No scleral icterus.  Neck: Normal range of motion. Neck supple. No JVD present. No thyromegaly present.  Cardiovascular: Regular rhythm, normal heart sounds and intact distal pulses. Exam reveals no gallop and no friction rub.  No murmur heard. Pulmonary/Chest: Effort normal and breath sounds normal. He exhibits no tenderness.  Abdominal: Soft. Bowel sounds are normal. He exhibits no distension and no mass. There is no tenderness.  Genitourinary: Penis normal.  Musculoskeletal: Normal range of motion. He exhibits no edema or  tenderness.  Lymphadenopathy:    He has no cervical adenopathy.  Neurological: He is alert. He has normal reflexes. No cranial nerve deficit. Coordination normal.  Skin: Skin is warm and dry. No rash noted.  Psychiatric: He has a normal mood and affect. His behavior is normal.          Assessment & Plan:   Preventive health examination Essential hypertension Dyslipidemia Ongoing tobacco use History of colonic polyps  Will review screening lab Total smoking cessation encouraged  Follow-up one year  Nyoka Cowden

## 2017-05-22 ENCOUNTER — Encounter: Payer: Self-pay | Admitting: Internal Medicine

## 2017-05-22 ENCOUNTER — Other Ambulatory Visit: Payer: Self-pay | Admitting: Internal Medicine

## 2017-05-23 ENCOUNTER — Other Ambulatory Visit: Payer: Self-pay | Admitting: Internal Medicine

## 2017-05-23 DIAGNOSIS — Z720 Tobacco use: Secondary | ICD-10-CM

## 2017-05-23 DIAGNOSIS — Z0000 Encounter for general adult medical examination without abnormal findings: Secondary | ICD-10-CM

## 2017-05-23 MED ORDER — EPINEPHRINE 0.3 MG/0.3ML IJ SOAJ: 0.3000 mg | Freq: Once | INTRAMUSCULAR | 3 refills | Status: DC

## 2017-05-23 MED ORDER — SILDENAFIL CITRATE 100 MG PO TABS: ORAL_TABLET | ORAL | 1 refills | Status: DC

## 2017-05-23 MED FILL — ATORVASTATIN 40 MG TABLET: 40 | 90 days supply | Qty: 90 | Fill #0

## 2017-05-23 MED FILL — LOSARTAN POTASSIUM 50 MG TA: 50 | 90 days supply | Qty: 90 | Fill #0

## 2017-05-26 ENCOUNTER — Other Ambulatory Visit: Payer: Self-pay | Admitting: Internal Medicine

## 2017-05-26 DIAGNOSIS — Z720 Tobacco use: Secondary | ICD-10-CM

## 2017-06-05 ENCOUNTER — Other Ambulatory Visit: Payer: Self-pay | Admitting: Internal Medicine

## 2017-06-05 ENCOUNTER — Ambulatory Visit (HOSPITAL_COMMUNITY)
Admission: RE | Admit: 2017-06-05 | Discharge: 2017-06-05 | Disposition: A | Discharge status: Home / Self Care | Payer: 59 | Source: Ambulatory Visit | Attending: Cardiology | Admitting: Cardiology

## 2017-06-05 DIAGNOSIS — Z720 Tobacco use: Secondary | ICD-10-CM

## 2017-06-05 DIAGNOSIS — Z136 Encounter for screening for cardiovascular disorders: Secondary | ICD-10-CM | POA: Diagnosis not present

## 2017-06-05 DIAGNOSIS — I708 Atherosclerosis of other arteries: Secondary | ICD-10-CM | POA: Insufficient documentation

## 2017-06-05 DIAGNOSIS — F17200 Nicotine dependence, unspecified, uncomplicated: Secondary | ICD-10-CM | POA: Insufficient documentation

## 2017-06-05 DIAGNOSIS — I77811 Abdominal aortic ectasia: Secondary | ICD-10-CM | POA: Diagnosis not present

## 2017-06-06 MED FILL — SILDENAFIL CITRATE 100 MG T: 100 | 30 days supply | Qty: 6 | Fill #0

## 2017-06-06 MED FILL — EPINEPHRINE 0.3 MG AUTO-INJ: 0.3 | 30 days supply | Qty: 2 | Fill #0

## 2017-06-19 ENCOUNTER — Other Ambulatory Visit: Payer: Self-pay | Admitting: *Deleted

## 2017-06-19 DIAGNOSIS — Z720 Tobacco use: Secondary | ICD-10-CM

## 2017-06-19 DIAGNOSIS — Z0000 Encounter for general adult medical examination without abnormal findings: Secondary | ICD-10-CM

## 2017-06-19 NOTE — Progress Notes (Signed)
am

## 2017-06-24 ENCOUNTER — Other Ambulatory Visit: Payer: Self-pay | Admitting: Acute Care

## 2017-06-24 DIAGNOSIS — F17210 Nicotine dependence, cigarettes, uncomplicated: Principal | ICD-10-CM

## 2017-06-24 DIAGNOSIS — Z122 Encounter for screening for malignant neoplasm of respiratory organs: Secondary | ICD-10-CM

## 2017-07-09 ENCOUNTER — Encounter: Payer: Self-pay | Admitting: Acute Care

## 2017-07-09 ENCOUNTER — Ambulatory Visit (INDEPENDENT_AMBULATORY_CARE_PROVIDER_SITE_OTHER): Payer: 59 | Admitting: Acute Care

## 2017-07-09 ENCOUNTER — Ambulatory Visit (INDEPENDENT_AMBULATORY_CARE_PROVIDER_SITE_OTHER)
Admission: RE | Admit: 2017-07-09 | Discharge: 2017-07-09 | Disposition: A | Discharge status: Home / Self Care | Payer: 59 | Source: Ambulatory Visit | Attending: Acute Care | Admitting: Acute Care

## 2017-07-09 DIAGNOSIS — F17210 Nicotine dependence, cigarettes, uncomplicated: Secondary | ICD-10-CM | POA: Diagnosis not present

## 2017-07-09 DIAGNOSIS — Z122 Encounter for screening for malignant neoplasm of respiratory organs: Secondary | ICD-10-CM

## 2017-07-09 NOTE — Progress Notes (Signed)
Shared Decision Making Visit Lung Cancer Screening Program [phone])   Eligibility:  Age 67 y.o.  Pack Years Smoking History Calculation 54 pack year smoking history (# packs/per year x # years smoked)  Recent History of coughing up blood  no  Unexplained weight loss? no ( >Than 15 pounds within the last 6 months )  Prior History Lung / other cancer no (Diagnosis within the last 5 years already requiring surveillance chest CT Scans).  Smoking Status Current Smoker  Former Smokers: Years since quit:NA  Quit Date: NA  Visit Components:  Discussion included one or more decision making aids. yes  Discussion included risk/benefits of screening. yes  Discussion included potential follow up diagnostic testing for abnormal scans. yes  Discussion included meaning and risk of over diagnosis. yes  Discussion included meaning and risk of False Positives. yes  Discussion included meaning of total radiation exposure. yes  Counseling Included:  Importance of adherence to annual lung cancer LDCT screening. yes  Impact of comorbidities on ability to participate in the program. yes  Ability and willingness to under diagnostic treatment. yes  Smoking Cessation Counseling:  Current Smokers:   Discussed importance of smoking cessation. yes  Information about tobacco cessation classes and interventions provided to patient. yes  Patient provided with "ticket" for LDCT Scan. yes  Symptomatic Patient. no  Counseling  Diagnosis Code: Tobacco Use Z72.0  Asymptomatic Patient yes  Counseling (Intermediate counseling: > three minutes counseling) E9937  Former Smokers:   Discussed the importance of maintaining cigarette abstinence. yes  Diagnosis Code: Personal History of Nicotine Dependence. J69.678  Information about tobacco cessation classes and interventions provided to patient. Yes  Patient provided with "ticket" for LDCT Scan. yes  Written Order for Lung Cancer  Screening with LDCT placed in Epic. Yes (CT Chest Lung Cancer Screening Low Dose W/O CM) [id number] Z12.2-Screening of respiratory organs Z87.891-Personal history of nicotine dependence  I have spent 25 minutes of face to face time with Mr. Loftus discussing the risks and benefits of lung cancer screening. We viewed a power point together that explained in detail the above noted topics. We paused at intervals to allow for questions to be asked and answered to ensure understanding.We discussed that the single most powerful action that he can take to decrease his risk of developing lung cancer is to quit smoking. We discussed whether or not he  is ready to commit to setting a quit date. He is currently not ready to set a quit date.We discussed options for tools to aid in quitting smoking including nicotine replacement therapy, non-nicotine medications, support groups, Quit Smart classes, and behavior modification. We discussed that often times setting smaller, more achievable goals, such as eliminating 1 cigarette a day for a week and then 2 cigarettes a day for a week can be helpful in slowly decreasing the number of cigarettes smoked. This allows for a sense of accomplishment as well as providing a clinical benefit. I gave him the " Be Stronger Than Your Excuses" card with contact information for community resources, classes, free nicotine replacement therapy, and access to mobile apps, text messaging, and on-line smoking cessation help. I have also given him my card and contact information in the event he needs to contact me. We discussed the time and location of the scan, and that either Doroteo Glassman RN or I will call with the results within 24-48 hours of receiving them. I have offered him  a copy of the power point we viewed  as  a resource in the event they need reinforcement of the concepts we discussed today in the office. The patient verbalized understanding of all of  the above and had no further questions  upon leaving the office. They have my contact information in the event they have any further questions.  I spent 3 minutes counseling on smoking cessation and the health risks of continued tobacco abuse.  I explained to the patient that there has been a high incidence of coronary artery disease noted on these exams. I explained that this is a non-gated exam therefore degree or severity cannot be determined. This patient is currently on statin therapy. I have asked the patient to follow-up with their PCP regarding any incidental finding of coronary artery disease and management with diet or medication as their PCP  feels is clinically indicated. The patient verbalized understanding of the above and had no further questions upon completion of the visit.      Magdalen Spatz, NP 07/09/2017

## 2017-07-15 ENCOUNTER — Telehealth: Payer: Self-pay | Admitting: Acute Care

## 2017-07-15 DIAGNOSIS — Z122 Encounter for screening for malignant neoplasm of respiratory organs: Secondary | ICD-10-CM

## 2017-07-15 DIAGNOSIS — F17210 Nicotine dependence, cigarettes, uncomplicated: Principal | ICD-10-CM

## 2017-07-15 NOTE — Telephone Encounter (Signed)
Denise ,  please place order for follow-up low-dose CT for 6 months from now.  And fax results to PCP.  I am awaiting the patient to return my call for results as there was no answer when I called today 07/15/2017. Thanks so much.

## 2017-07-15 NOTE — Telephone Encounter (Signed)
Russell Cox  returned my call.  I explained that his scan was read as a Lung  RADS 3, nodules that are probably benign findings, short term follow up suggested: includes nodules with a low likelihood of becoming a clinically active cancer. Radiology recommends a 6 month repeat LDCT follow up.we discussed that we felt like it may be due to the fact he had a cold at the time of scanning.  I reiterated that in the future if he thinks he is sick or if he is fighting an infection to please call us so that we can reschedule the scan.  I explained that we will do a six-month follow-up scan, and that someone will call him closer to the time to get it scheduled.  He verbalized understanding of the above and had no further questions at the completion of the call.  Denise please order scan and send results to PCP.  Thank you

## 2017-07-18 NOTE — Telephone Encounter (Signed)
Results faxed to PCP.  Order placed for 6 mth f/u CT Chest ct.

## 2017-09-01 ENCOUNTER — Other Ambulatory Visit: Payer: Self-pay | Admitting: Internal Medicine

## 2017-09-01 MED FILL — LOSARTAN POTASSIUM 50 MG TA: 50 | 90 days supply | Qty: 90 | Fill #0

## 2017-09-01 MED FILL — ATORVASTATIN 40 MG TABLET: 40 | 90 days supply | Qty: 90 | Fill #0

## 2017-11-24 ENCOUNTER — Other Ambulatory Visit: Payer: Self-pay | Admitting: Dermatology

## 2017-11-24 DIAGNOSIS — H61009 Unspecified perichondritis of external ear, unspecified ear: Secondary | ICD-10-CM | POA: Diagnosis not present

## 2017-11-24 DIAGNOSIS — H61002 Unspecified perichondritis of left external ear: Secondary | ICD-10-CM | POA: Diagnosis not present

## 2017-11-24 DIAGNOSIS — L570 Actinic keratosis: Secondary | ICD-10-CM | POA: Diagnosis not present

## 2017-11-24 DIAGNOSIS — D229 Melanocytic nevi, unspecified: Secondary | ICD-10-CM | POA: Diagnosis not present

## 2017-12-09 ENCOUNTER — Other Ambulatory Visit: Payer: Self-pay | Admitting: Internal Medicine

## 2017-12-10 MED FILL — ATORVASTATIN 40 MG TABLET: 40 | 90 days supply | Qty: 90 | Fill #0

## 2017-12-10 MED FILL — LOSARTAN POTASSIUM 50 MG TA: 50 | 90 days supply | Qty: 90 | Fill #0

## 2017-12-10 NOTE — Telephone Encounter (Signed)
Sent to the pharmacy by e[facility].  Last cpx 04/15/17.

## 2017-12-22 ENCOUNTER — Ambulatory Visit (INDEPENDENT_AMBULATORY_CARE_PROVIDER_SITE_OTHER): Payer: PPO

## 2017-12-22 ENCOUNTER — Ambulatory Visit (INDEPENDENT_AMBULATORY_CARE_PROVIDER_SITE_OTHER): Payer: PPO | Admitting: Orthopaedic Surgery

## 2017-12-22 ENCOUNTER — Encounter (INDEPENDENT_AMBULATORY_CARE_PROVIDER_SITE_OTHER): Payer: Self-pay | Admitting: Orthopaedic Surgery

## 2017-12-22 VITALS — BP 151/70 | HR 92 | Ht 71.0 in | Wt 165.0 lb

## 2017-12-22 DIAGNOSIS — M25561 Pain in right knee: Secondary | ICD-10-CM

## 2017-12-22 NOTE — Progress Notes (Signed)
Office Visit Note   Patient: Russell Cox           Date of Birth: [birth year]           MRN: [id number] Visit Date: 12/22/2017              Requested by: Marletta Lor, MD [place], [place] [address] PCP: Marletta Lor, MD   Assessment & Plan: Visit Diagnoses:  1. Acute pain of right knee     Plan: X-rays right knee were negative.  Possible small tear of the medial meniscus which is becoming less and less symptomatic.  Would suggest to continue to monitor the progress of his right knee.  Consider MRI scan if no improvement over the next 1 month.  Follow-Up Instructions: Return if symptoms worsen or fail to improve.   Orders:  Orders Placed This Encounter  Procedures  . XR KNEE 3 VIEW RIGHT   No orders of the defined types were placed in this encounter.     Procedures: No procedures performed   Clinical Data: No additional findings.   Subjective: Chief Complaint  Patient presents with  . New Patient (Initial Visit)    R KNEE SWELLING WITH PAIN FOR 3 WEEK, STEPPED OFF OF BOAT AND IN RIVER AND TWISTED IT A LITTLE  Russell Cox was visiting in [place] 3 to 4 weeks ago when he was getting off a small boat and stepped into a river and twisted his right knee.  He did not have much pain for several days and then had some swelling and stiffness.  She was still having some pain but notes that over the past several days she is feeling much better.  No feeling of instability.  HPI  Review of Systems  Constitutional: Negative for fatigue and fever.  HENT: Negative for ear pain.   Eyes: Negative for pain.  Respiratory: Negative for cough and shortness of breath.   Cardiovascular: Positive for leg swelling.  Gastrointestinal: Negative for constipation and diarrhea.  Genitourinary: Negative for difficulty urinating.  Musculoskeletal: Negative for back pain and neck pain.  Skin: Negative for rash.  Allergic/Immunologic: Negative for food allergies.    Neurological: Negative for weakness and numbness.  Hematological: Does not bruise/bleed easily.  Psychiatric/Behavioral: Negative for sleep disturbance.     Objective: Vital Signs: BP (!) 151/70 (BP Location: Left Arm, Patient Position: Sitting, Cuff Size: Normal)   Pulse 92   Ht 5\' 11"  (1.803 m)   Wt 165 lb (74.8 kg)   BMI 23.01 kg/m   Physical Exam  Constitutional: He is oriented to person, place, and time. He appears well-developed and well-nourished.  HENT:  Mouth/Throat: Oropharynx is clear and moist.  Eyes: Pupils are equal, round, and reactive to light. EOM are normal.  Pulmonary/Chest: Effort normal.  Neurological: He is alert and oriented to person, place, and time.  Skin: Skin is warm and dry.  Psychiatric: He has a normal mood and affect. His behavior is normal.    Ortho Exam awake alert and oriented x3.  Comfortable sitting.  Very minimal effusion right knee.  Slight posterior medial joint pain.  No instability.  No induration or other skin changes.  No popliteal pain.  No calf discomfort.  No distal edema.  No opening with varus or valgus stress.  Negative anterior drawer sign.  Straight leg raise negative.  No thigh or groin pain  Specialty Comments:  No specialty comments available.  Imaging: Xr Knee 3 View Right  Result Date: 12/22/2017 Films of the right knee were obtained in several projections standing.  No acute changes.  No ectopic calcification or obvious osteoarthritis    PMFS History: Patient Active Problem List   Diagnosis Date Noted  . Impaired glucose tolerance 08/05/2014  . History of colonic polyps 08/05/2014  . Dyslipidemia 03/25/2011  . Tobacco abuse 03/25/2011  . Hypertension 12/05/2010  . URI 05/28/2007  . SINUSITIS, ACUTE MAXILLARY 02/24/2007   Past Medical History:  Diagnosis Date  . Cataract    removed bilaterally  . Cigarette smoker   . COPD (chronic obstructive pulmonary disease) ([place])   . Hyperlipidemia   . Hypertension    . SINUSITIS, ACUTE MAXILLARY 02/24/2007  . URI 05/28/2007    Family History  Problem Relation Age of Onset  . Cancer Mother        unknown what type  . Colon cancer Mother   . Heart disease Father   . Cancer Father        unclear, maybe thyroid  . Hypertension Sister   . Hypertension Brother   . Colon polyps Neg Hx   . Esophageal cancer Neg Hx   . Rectal cancer Neg Hx   . Stomach cancer Neg Hx     Past Surgical History:  Procedure Laterality Date  . CATARACT EXTRACTION W/ INTRAOCULAR LENS IMPLANT Bilateral    left  . COLONOSCOPY    . ELBOW SURGERY  1988   tennis  . hemmorrhoidectomy  1988  . POLYPECTOMY    . SHOULDER ARTHROSCOPY W/ ACROMIAL REPAIR     left   Social History   Occupational History  . Not on file  Tobacco Use  . Smoking status: Current Every Day Smoker    Packs/day: 1.00    Years: 54.00    Pack years: 54.00    Types: Cigarettes  . Smokeless tobacco: Never Used  Substance and Sexual Activity  . Alcohol use: Yes    Comment: socially  . Drug use: No  . Sexual activity: Not on file

## 2018-02-10 ENCOUNTER — Ambulatory Visit (INDEPENDENT_AMBULATORY_CARE_PROVIDER_SITE_OTHER)
Admission: RE | Admit: 2018-02-10 | Discharge: 2018-02-10 | Disposition: A | Discharge status: Home / Self Care | Payer: PPO | Source: Ambulatory Visit | Attending: Acute Care | Admitting: Acute Care

## 2018-02-10 DIAGNOSIS — F17210 Nicotine dependence, cigarettes, uncomplicated: Secondary | ICD-10-CM

## 2018-02-10 DIAGNOSIS — J439 Emphysema, unspecified: Secondary | ICD-10-CM | POA: Diagnosis not present

## 2018-02-10 DIAGNOSIS — Z122 Encounter for screening for malignant neoplasm of respiratory organs: Secondary | ICD-10-CM | POA: Diagnosis not present

## 2018-02-13 ENCOUNTER — Other Ambulatory Visit: Payer: Self-pay | Admitting: Acute Care

## 2018-02-13 DIAGNOSIS — F17210 Nicotine dependence, cigarettes, uncomplicated: Principal | ICD-10-CM

## 2018-02-13 DIAGNOSIS — Z122 Encounter for screening for malignant neoplasm of respiratory organs: Secondary | ICD-10-CM

## 2018-03-24 ENCOUNTER — Other Ambulatory Visit: Payer: Self-pay | Admitting: Internal Medicine

## 2018-03-24 MED FILL — ATORVASTATIN 40 MG TABLET: 40 | 90 days supply | Qty: 90 | Fill #0

## 2018-03-24 MED FILL — LOSARTAN POTASSIUM 50 MG TA: 50 | 30 days supply | Qty: 30 | Fill #0

## 2018-03-24 NOTE — Telephone Encounter (Signed)
Pt aware to follow up with new PCP for further refills due to Dr.Kwiakowski's retiring. Rx will be refilled for 3 months.

## 2018-04-09 ENCOUNTER — Encounter (INDEPENDENT_AMBULATORY_CARE_PROVIDER_SITE_OTHER): Payer: Self-pay | Admitting: Orthopaedic Surgery

## 2018-04-09 ENCOUNTER — Ambulatory Visit (HOSPITAL_COMMUNITY)
Admission: RE | Admit: 2018-04-09 | Discharge: 2018-04-09 | Disposition: A | Discharge status: Home / Self Care | Payer: PPO | Source: Ambulatory Visit | Attending: Internal Medicine | Admitting: Internal Medicine

## 2018-04-09 ENCOUNTER — Ambulatory Visit (INDEPENDENT_AMBULATORY_CARE_PROVIDER_SITE_OTHER): Payer: PPO | Admitting: Orthopaedic Surgery

## 2018-04-09 VITALS — BP 159/81 | HR 79 | Ht 71.0 in | Wt 170.0 lb

## 2018-04-09 DIAGNOSIS — M79661 Pain in right lower leg: Secondary | ICD-10-CM | POA: Diagnosis not present

## 2018-04-09 NOTE — Progress Notes (Signed)
Right lower extremity venous duplex completed. Preliminary results - There is no evidence of DVT. There is an area of mixed echoes in the popliteal fossa coursing into the proxiaml calf consistent with a possible ruptured Baker's cyst. Russell Cox 04/09/2018 4:37 PM

## 2018-04-09 NOTE — Progress Notes (Signed)
Office Visit Note   Patient: Russell Cox           Date of Birth: 05-18-1951           MRN: [id number] Visit Date: 04/09/2018              Requested by: Marletta Lor, MD [place], [place] [address] PCP: Marletta Lor, MD   Assessment & Plan: Visit Diagnoses:  1. Right calf pain     Plan: DX.AJOI visited the office for evaluation of right calf pain.  He injured his knee while fishing in [place] in June.  I saw him in July and thought that he might have had a meniscal tear.  He did fairly well only until recently when he began to experience some swelling in the calf of his leg.  He does smoke.  He is had some discomfort in his knee but mostly behind his knee and his calf.  I am concerned that he might either have a leaking Baker's cyst or DVT.  Will order a Doppler study and evaluate thereafter  Follow-Up Instructions: Return Doppler right calf.   Orders:  No orders of the defined types were placed in this encounter.  No orders of the defined types were placed in this encounter.     Procedures: No procedures performed   Clinical Data: No additional findings.   Subjective: Chief Complaint  Patient presents with  . New Patient (Initial Visit)    R KNEE PAIN AND SWELLING FOR 2-3 WEEKS, NO PRIOR INJECTIONS OR SURGERY   Initial onset of right knee pain in June while fishing in [place].  Russell Cox was seen in July with the possibility of a medial meniscal tear he actually seemed to improve only to have some pain behind his knee and into his right calf associated with some swelling.  He has not had any shortness of breath or chest pain.  He does smoke.  HPI  Review of Systems  Constitutional: Negative for fatigue and fever.  HENT: Negative for ear pain.   Eyes: Negative for pain.  Respiratory: Negative for cough and shortness of breath.   Cardiovascular: Positive for leg swelling.  Gastrointestinal: Negative for constipation and diarrhea.    Genitourinary: Negative for difficulty urinating.  Musculoskeletal: Negative for back pain and neck pain.  Skin: Negative for rash.  Allergic/Immunologic: Negative for food allergies.  Neurological: Negative for weakness and numbness.  Hematological: Does not bruise/bleed easily.  Psychiatric/Behavioral: Negative for sleep disturbance.     Objective: Vital Signs: BP (!) 159/81 (BP Location: Left Arm, Patient Position: Sitting, Cuff Size: Normal)   Pulse 79   Ht 5\' 11"  (1.803 m)   Wt 170 lb (77.1 kg)   BMI 23.71 kg/m   Physical Exam  Constitutional: He is oriented to person, place, and time. He appears well-developed and well-nourished.  HENT:  Mouth/Throat: Oropharynx is clear and moist.  Eyes: Pupils are equal, round, and reactive to light. EOM are normal.  Pulmonary/Chest: Effort normal.  Neurological: He is alert and oriented to person, place, and time.  Skin: Skin is warm and dry.  Psychiatric: He has a normal mood and affect. His behavior is normal.    Ortho Exam awake alert and oriented x3.  Comfortable sitting.  Very minimal effusion right knee.  Little bit of medial joint pain without popping or clicking.  No patella pain.  No lateral joint pain.  No instability with varus valgus stress.  Negative anterior  drawer sign.  Very mild calf pain with some fullness in the popliteal space that could represent a Baker's cyst.  Neurovascular exam intact distally.  Good pulses  Specialty Comments:  No specialty comments available.  Imaging: No results found.   PMFS History: Patient Active Problem List   Diagnosis Date Noted  . Impaired glucose tolerance 08/05/2014  . History of colonic polyps 08/05/2014  . Dyslipidemia 03/25/2011  . Tobacco abuse 03/25/2011  . Hypertension 12/05/2010  . URI 05/28/2007  . SINUSITIS, ACUTE MAXILLARY 02/24/2007   Past Medical History:  Diagnosis Date  . Cataract    removed bilaterally  . Cigarette smoker   . COPD (chronic obstructive  pulmonary disease) ([place])   . Hyperlipidemia   . Hypertension   . SINUSITIS, ACUTE MAXILLARY 02/24/2007  . URI 05/28/2007    Family History  Problem Relation Age of Onset  . Cancer Mother        unknown what type  . Colon cancer Mother   . Heart disease Father   . Cancer Father        unclear, maybe thyroid  . Hypertension Sister   . Hypertension Brother   . Colon polyps Neg Hx   . Esophageal cancer Neg Hx   . Rectal cancer Neg Hx   . Stomach cancer Neg Hx     Past Surgical History:  Procedure Laterality Date  . CATARACT EXTRACTION W/ INTRAOCULAR LENS IMPLANT Bilateral    left  . COLONOSCOPY    . ELBOW SURGERY  1988   tennis  . hemmorrhoidectomy  1988  . POLYPECTOMY    . SHOULDER ARTHROSCOPY W/ ACROMIAL REPAIR     left   Social History   Occupational History  . Not on file  Tobacco Use  . Smoking status: Current Every Day Smoker    Packs/day: 1.00    Years: 54.00    Pack years: 54.00    Types: Cigarettes  . Smokeless tobacco: Never Used  Substance and Sexual Activity  . Alcohol use: Yes    Comment: socially  . Drug use: No  . Sexual activity: Not on file

## 2018-04-13 ENCOUNTER — Other Ambulatory Visit (INDEPENDENT_AMBULATORY_CARE_PROVIDER_SITE_OTHER): Payer: Self-pay | Admitting: Radiology

## 2018-04-13 DIAGNOSIS — G8929 Other chronic pain: Secondary | ICD-10-CM

## 2018-04-13 DIAGNOSIS — M25561 Pain in right knee: Principal | ICD-10-CM

## 2018-04-29 MED FILL — LOSARTAN POTASSIUM 50 MG TA: 50 | 30 days supply | Qty: 30 | Fill #1

## 2018-04-30 ENCOUNTER — Ambulatory Visit
Admission: RE | Admit: 2018-04-30 | Discharge: 2018-04-30 | Disposition: A | Discharge status: Home / Self Care | Payer: PPO | Source: Ambulatory Visit | Attending: Orthopaedic Surgery | Admitting: Orthopaedic Surgery

## 2018-04-30 DIAGNOSIS — G8929 Other chronic pain: Secondary | ICD-10-CM

## 2018-04-30 DIAGNOSIS — M1711 Unilateral primary osteoarthritis, right knee: Secondary | ICD-10-CM | POA: Diagnosis not present

## 2018-04-30 DIAGNOSIS — M25561 Pain in right knee: Principal | ICD-10-CM

## 2018-05-01 ENCOUNTER — Ambulatory Visit (INDEPENDENT_AMBULATORY_CARE_PROVIDER_SITE_OTHER): Payer: PPO | Admitting: Orthopaedic Surgery

## 2018-05-01 ENCOUNTER — Encounter (INDEPENDENT_AMBULATORY_CARE_PROVIDER_SITE_OTHER): Payer: Self-pay | Admitting: Orthopaedic Surgery

## 2018-05-01 VITALS — BP 140/81 | HR 79 | Ht 71.0 in | Wt 170.0 lb

## 2018-05-01 DIAGNOSIS — G8929 Other chronic pain: Secondary | ICD-10-CM | POA: Diagnosis not present

## 2018-05-01 DIAGNOSIS — M25561 Pain in right knee: Secondary | ICD-10-CM | POA: Diagnosis not present

## 2018-05-01 NOTE — Progress Notes (Signed)
Office Visit Note   Patient: Russell Cox           Date of Birth: 11/05/1950           MRN: [id number] Visit Date: 05/01/2018              Requested by: Marletta Lor, MD [place], [place] [address] PCP: Caren Macadam, MD   Assessment & Plan: Visit Diagnoses:  1. Chronic pain of right knee     Plan: MRI scan was negative for meniscal tearing of either the medial lateral meniscus.  There are chondromalacic changes at the patellofemoral joint and the medial compartment as well as a Baker's cyst.  Long discussion regarding MRI scan findings of what he may expect over time.  Presently doing well.  Also had Doppler study of his right lower extremity without evidence of DVT and a Baker's cyst was not identified at the time but visible by MRI scan  Follow-Up Instructions: Return if symptoms worsen or fail to improve.   Orders:  No orders of the defined types were placed in this encounter.  No orders of the defined types were placed in this encounter.     Procedures: No procedures performed   Clinical Data: No additional findings.   Subjective: Chief Complaint  Patient presents with  . Right Knee - Pain  . Knee Pain    review of MRI of right knee. no new or worsening symptoms.   [name] is had some recurrent pain in his right knee prompting an MRI scan.  I was also concerned that he might have DVT based on calf pain.  The Doppler study was negative for DVT.  Cystic structure in the popliteal space was not identified.  However, there is a popliteal cyst by MRI scan that could leak and cause some pain in his calf.  RI scan did not demonstrate a meniscal tear but does have some arthritic changes particularly at the patellofemoral joint and the medial compartment.  HPI  Review of Systems   Objective: Vital Signs: BP 140/81 (BP Location: Left Arm, Patient Position: Sitting, Cuff Size: Normal)   Pulse 79   Ht 5\' 11"  (1.803 m)   Wt 170 lb (77.1 kg)    BMI 23.71 kg/m   Physical Exam  Constitutional: He is oriented to person, place, and time. He appears well-developed and well-nourished.  HENT:  Mouth/Throat: Oropharynx is clear and moist.  Eyes: Pupils are equal, round, and reactive to light. EOM are normal.  Pulmonary/Chest: Effort normal.  Neurological: He is alert and oriented to person, place, and time.  Skin: Skin is warm and dry.  Psychiatric: He has a normal mood and affect. His behavior is normal.    Ortho Exam no effusion right knee.  Mild medial joint pain.  Full extension and flexion of 105 degrees.  Walks without a limp.  I could feel a small popliteal cyst but without pain.  No calf discomfort.  Neurovascular exam.  No instability. Specialty Comments:  No specialty comments available.  Imaging: No results found.   PMFS History: Patient Active Problem List   Diagnosis Date Noted  . Impaired glucose tolerance 08/05/2014  . History of colonic polyps 08/05/2014  . Dyslipidemia 03/25/2011  . Tobacco abuse 03/25/2011  . Hypertension 12/05/2010  . URI 05/28/2007  . SINUSITIS, ACUTE MAXILLARY 02/24/2007   Past Medical History:  Diagnosis Date  . Cataract    removed bilaterally  . Cigarette smoker   .  COPD (chronic obstructive pulmonary disease) ([place])   . Hyperlipidemia   . Hypertension   . SINUSITIS, ACUTE MAXILLARY 02/24/2007  . URI 05/28/2007    Family History  Problem Relation Age of Onset  . Cancer Mother        unknown what type  . Colon cancer Mother   . Heart disease Father   . Cancer Father        unclear, maybe thyroid  . Hypertension Sister   . Hypertension Brother   . Colon polyps Neg Hx   . Esophageal cancer Neg Hx   . Rectal cancer Neg Hx   . Stomach cancer Neg Hx     Past Surgical History:  Procedure Laterality Date  . CATARACT EXTRACTION W/ INTRAOCULAR LENS IMPLANT Bilateral    left  . COLONOSCOPY    . ELBOW SURGERY  1988   tennis  . hemmorrhoidectomy  1988  . POLYPECTOMY    .  SHOULDER ARTHROSCOPY W/ ACROMIAL REPAIR     left   Social History   Occupational History  . Not on file  Tobacco Use  . Smoking status: Current Every Day Smoker    Packs/day: 1.00    Years: 54.00    Pack years: 54.00    Types: Cigarettes  . Smokeless tobacco: Never Used  Substance and Sexual Activity  . Alcohol use: Yes    Comment: socially  . Drug use: No  . Sexual activity: Not on file     Garald Balding, MD   Note - This record has been created using [employer].  Chart creation errors have been sought, but may not always  have been located. Such creation errors do not reflect on  the standard of medical care.

## 2018-05-14 ENCOUNTER — Encounter: Payer: Self-pay | Admitting: Family Medicine

## 2018-05-14 ENCOUNTER — Ambulatory Visit (INDEPENDENT_AMBULATORY_CARE_PROVIDER_SITE_OTHER): Payer: PPO | Admitting: Family Medicine

## 2018-05-14 VITALS — BP 160/84 | HR 88 | Temp 98.1°F | Wt 179.1 lb

## 2018-05-14 DIAGNOSIS — I10 Essential (primary) hypertension: Secondary | ICD-10-CM | POA: Diagnosis not present

## 2018-05-14 DIAGNOSIS — E785 Hyperlipidemia, unspecified: Secondary | ICD-10-CM

## 2018-05-14 DIAGNOSIS — R7302 Impaired glucose tolerance (oral): Secondary | ICD-10-CM | POA: Diagnosis not present

## 2018-05-14 LAB — CBC WITH DIFFERENTIAL/PLATELET
BASOS ABS: 0.1 10*3/uL (ref 0.0–0.1)
Basophils Relative: 1.3 % (ref 0.0–3.0)
Eosinophils Absolute: 0.2 10*3/uL (ref 0.0–0.7)
Eosinophils Relative: 3.9 % (ref 0.0–5.0)
HCT: 47.5 % (ref 39.0–52.0)
Hemoglobin: 16 g/dL (ref 13.0–17.0)
LYMPHS ABS: 1.6 10*3/uL (ref 0.7–4.0)
LYMPHS PCT: 27.1 % (ref 12.0–46.0)
MCHC: 33.8 g/dL (ref 30.0–36.0)
MCV: 97.8 fl (ref 78.0–100.0)
MONOS PCT: 8.7 % (ref 3.0–12.0)
Monocytes Absolute: 0.5 10*3/uL (ref 0.1–1.0)
NEUTROS PCT: 59 % (ref 43.0–77.0)
Neutro Abs: 3.5 10*3/uL (ref 1.4–7.7)
Platelets: 228 10*3/uL (ref 150.0–400.0)
RBC: 4.86 Mil/uL (ref 4.22–5.81)
RDW: 12.9 % (ref 11.5–15.5)
WBC: 5.9 10*3/uL (ref 4.0–10.5)

## 2018-05-14 LAB — COMPREHENSIVE METABOLIC PANEL
ALT: 21 U/L (ref 0–53)
AST: 21 U/L (ref 0–37)
Albumin: 4.7 g/dL (ref 3.5–5.2)
Alkaline Phosphatase: 64 U/L (ref 39–117)
BILIRUBIN TOTAL: 0.7 mg/dL (ref 0.2–1.2)
BUN: 17 mg/dL (ref 6–23)
CALCIUM: 9.8 mg/dL (ref 8.4–10.5)
CO2: 27 mEq/L (ref 19–32)
Chloride: 101 mEq/L (ref 96–112)
Creatinine, Ser: 1.1 mg/dL (ref 0.40–1.50)
GFR: 70.55 mL/min (ref >60.00)
Glucose, Bld: 108 mg/dL — ABNORMAL HIGH (ref 70–99)
Potassium: 5.2 mEq/L — ABNORMAL HIGH (ref 3.5–5.1)
Sodium: 136 mEq/L (ref 135–145)
TOTAL PROTEIN: 7.5 g/dL (ref 6.0–8.3)

## 2018-05-14 LAB — LIPID PANEL
Cholesterol: 195 mg/dL (ref 0–200)
HDL: 70.5 mg/dL (ref >39.00)
LDL Cholesterol: 111 mg/dL — ABNORMAL HIGH (ref 0–99)
NonHDL: 124.72
TRIGLYCERIDES: 71 mg/dL (ref 0.0–149.0)
Total CHOL/HDL Ratio: 3
VLDL: 14.2 mg/dL (ref 0.0–40.0)

## 2018-05-14 LAB — TSH: TSH: 1.84 u[IU]/mL (ref 0.35–4.50)

## 2018-05-14 LAB — HEMOGLOBIN A1C: HEMOGLOBIN A1C: 5.8 % (ref 4.6–6.5)

## 2018-05-14 MED ORDER — LOSARTAN POTASSIUM 50 MG PO TABS: 75.0000 mg | ORAL_TABLET | Freq: Every day | ORAL | 5 refills | Status: DC

## 2018-05-14 MED ORDER — ZOSTER VAC RECOMB ADJUVANTED 50 MCG/0.5ML IM SUSR: 0.5000 mL | Freq: Once | INTRAMUSCULAR | 0 refills | Status: AC

## 2018-05-14 NOTE — Patient Instructions (Signed)
Let me know how your blood pressures look with 75mg  daily of the losartan (1.5 of your current pills). I will then send in refill for you at that time.   I will call you with bloodwork results when I get them.

## 2018-05-14 NOTE — Progress Notes (Signed)
Russell Cox DOB: November 25, 1950 Encounter date: 05/14/2018  This is a 67 y.o. male who presents to establish care. Chief Complaint  Patient presents with  . Transitions Of Care    History of present illness:  Impaired glucose tolerance:not watching what he eats. Stays active, no exercise.   BWL:SLHTDS will check bp at home 140/80. Takes the losartan.   Hyperlipidemia:no issues with lipitor.   Right knee pain- following with Dr. Durward Fortes. Has bakers cysts. Stays swollen but not limiting him at this point.   Tried chantix for quitting smoking- didn't do well after weaning off. Smokes 1PPD. Never quit. Not sure that he wants to quit.      Past Medical History:  Diagnosis Date  . Cataract    removed bilaterally  . Cigarette smoker   . COPD (chronic obstructive pulmonary disease) ([place])   . Hyperlipidemia   . Hypertension   . SINUSITIS, ACUTE MAXILLARY 02/24/2007  . URI 05/28/2007   Past Surgical History:  Procedure Laterality Date  . CATARACT EXTRACTION W/ INTRAOCULAR LENS IMPLANT Bilateral    left  . COLONOSCOPY  03/2016   repeat in 5 years  . ELBOW SURGERY  1988   tennis  . hemmorrhoidectomy  1988  . POLYPECTOMY    . SHOULDER ARTHROSCOPY W/ ACROMIAL REPAIR     left   Allergies  Allergen Reactions  . Bee Venom Anaphylaxis  . Penicillins Hives   Current Meds  Medication Sig  . aspirin 81 MG tablet Take 1 tablet (81 mg total) daily by mouth.  Marland Kitchen atorvastatin (LIPITOR) 40 MG tablet TAKE 1 TABLET BY MOUTH ONCE DAILY *NEED TO SCHEDULE PHYSICAL*  . losartan (COZAAR) 50 MG tablet Take 1.5 tablets (75 mg total) by mouth daily.  . sildenafil (VIAGRA) 100 MG tablet TAKE 1 TABLET BY MOUTH WHEN NEEDED FOR ERECTILE DYSFUNCTION  . [DISCONTINUED] losartan (COZAAR) 50 MG tablet TAKE 1 TABLET BY MOUTH ONCE DAILY (NEED PHYSICAL)   Current Facility-Administered Medications for the 05/14/18 encounter (Office Visit) with Caren Macadam, MD  Medication  . 0.9 %  sodium chloride  infusion   Social History   Tobacco Use  . Smoking status: Current Every Day Smoker    Packs/day: 1.00    Years: 54.00    Pack years: 54.00    Types: Cigarettes  . Smokeless tobacco: Never Used  Substance Use Topics  . Alcohol use: Yes    Comment: socially   Family History  Problem Relation Age of Onset  . Cancer Mother        unknown what type  . Colon cancer Mother   . Arthritis Mother   . Heart disease Father   . Cancer Father        thyroid  . Hypertension Sister   . Hypertension Brother   . Bladder Cancer Brother 68  . Diabetes Maternal Grandmother   . Cancer Maternal Grandfather        lung  . Colon polyps Neg Hx   . Esophageal cancer Neg Hx   . Rectal cancer Neg Hx   . Stomach cancer Neg Hx      Review of Systems  Constitutional: Negative for chills, fatigue and fever.  Respiratory: Negative for cough, chest tightness, shortness of breath and wheezing.   Cardiovascular: Negative for chest pain, palpitations and leg swelling.    Objective:  BP (!) 160/84 (BP Location: Left Arm, Patient Position: Sitting, Cuff Size: Normal)   Pulse 88   Temp 98.1 F (36.7 C) (  Oral)   Wt 179 lb 1.6 oz (81.2 kg)   SpO2 95%   BMI 24.98 kg/m   Weight: 179 lb 1.6 oz (81.2 kg)   BP Readings from Last 3 Encounters:  05/14/18 (!) 160/84  05/01/18 140/81  04/09/18 (!) 159/81   Wt Readings from Last 3 Encounters:  05/14/18 179 lb 1.6 oz (81.2 kg)  05/01/18 170 lb (77.1 kg)  04/09/18 170 lb (77.1 kg)    Physical Exam  Constitutional: He is oriented to person, place, and time. He appears well-developed and well-nourished. No distress.  Cardiovascular: Normal rate, regular rhythm and normal heart sounds. Exam reveals no friction rub.  No murmur heard. No lower extremity edema  Pulmonary/Chest: Effort normal and breath sounds normal. No respiratory distress. He has no wheezes. He has no rales.  Neurological: He is alert and oriented to person, place, and time.   Psychiatric: His behavior is normal. Cognition and memory are normal.    Assessment/Plan: 1. Essential hypertension Recheck of blood pressure in the office was the same.  I have asked him to increase his losartan to 75 mg daily and report blood pressures back to me in 2 weeks time.  Would prefer for treated blood pressure goal to be closer to 353 systolic.  If this is not obtained can increase further to 100 mg daily of losartan. - CBC with Differential/Platelet; Future - Comprehensive metabolic panel; Future - Comprehensive metabolic panel - CBC with Differential/Platelet  2. Dyslipidemia  - Lipid panel; Future - TSH; Future - TSH - Lipid panel  3. Impaired glucose tolerance  - Hemoglobin A1c; Future - Hemoglobin A1c   Return in about 6 months (around 11/13/2018) for physical exam.  [name], MD

## 2018-05-15 ENCOUNTER — Encounter: Payer: PPO | Admitting: Family Medicine

## 2018-05-25 MED FILL — LOSARTAN POTASSIUM 50 MG TA: 50 | 30 days supply | Qty: 30 | Fill #2

## 2018-06-11 MED FILL — SHINGRIX 50 MCG SUS: 50 | 1 days supply | Qty: 1 | Fill #0

## 2018-06-15 ENCOUNTER — Other Ambulatory Visit: Payer: Self-pay | Admitting: Family Medicine

## 2018-06-15 MED ORDER — LOSARTAN POTASSIUM 50 MG PO TABS: 75.0000 mg | ORAL_TABLET | Freq: Every day | ORAL | 5 refills | Status: DC

## 2018-06-15 MED FILL — LOSARTAN POTASSIUM 50 MG TA: 50 | 30 days supply | Qty: 45 | Fill #0

## 2018-06-15 NOTE — Telephone Encounter (Signed)
Spoke with patient. He is correct he does not need a physical yet. Patient was made aware that the Losartan was sent in 05/14/18 with 5 refills. He stated that the pharmacy told him he had no refills. Will call the patients pharmacy.

## 2018-06-15 NOTE — Telephone Encounter (Signed)
Spoke with pharmacy. They did not receive the Rx from 05/14/18. Rx has been resent. Patient is aware.

## 2018-06-15 NOTE — Telephone Encounter (Signed)
Copied from [place] [id number]. Topic: Quick Communication - Rx Refill/Question >> Jun 15, 2018 11:01 AM Rutherford Nail, NT wrote: **Patient calling and states that he was told he needs a physical before refills could be given. States that at his appointment with Dr Ethlyn Gallery on 05/14/18, she recommended physical to be 11/2018. Dustin to have CMA look into this and contact patient.**   Medication: losartan (COZAAR) 50 MG tablet  atorvastatin (LIPITOR) 40 MG tablet  (pharmacy notes from Dr Todd-needs physical)    Has the patient contacted their pharmacy? Yes.   (Agent: If no, request that the patient contact the pharmacy for the refill.) (Agent: If yes, when and what did the pharmacy advise?)  Preferred Pharmacy (with phone number or street name): [place], [place]: Please be advised that RX refills may take up to 3 business days. We ask that you follow-up with your pharmacy.

## 2018-06-18 DIAGNOSIS — J40 Bronchitis, not specified as acute or chronic: Secondary | ICD-10-CM | POA: Diagnosis not present

## 2018-06-18 DIAGNOSIS — L02414 Cutaneous abscess of left upper limb: Secondary | ICD-10-CM | POA: Diagnosis not present

## 2018-06-22 ENCOUNTER — Encounter: Payer: Self-pay | Admitting: Family Medicine

## 2018-06-22 ENCOUNTER — Ambulatory Visit (INDEPENDENT_AMBULATORY_CARE_PROVIDER_SITE_OTHER): Payer: PPO | Admitting: Family Medicine

## 2018-06-22 ENCOUNTER — Other Ambulatory Visit: Payer: Self-pay

## 2018-06-22 VITALS — BP 142/80 | HR 86 | Temp 98.5°F | Wt 176.7 lb

## 2018-06-22 DIAGNOSIS — I10 Essential (primary) hypertension: Secondary | ICD-10-CM | POA: Diagnosis not present

## 2018-06-22 DIAGNOSIS — Z720 Tobacco use: Secondary | ICD-10-CM

## 2018-06-22 DIAGNOSIS — R05 Cough: Secondary | ICD-10-CM

## 2018-06-22 DIAGNOSIS — L0291 Cutaneous abscess, unspecified: Secondary | ICD-10-CM | POA: Diagnosis not present

## 2018-06-22 DIAGNOSIS — R059 Cough, unspecified: Secondary | ICD-10-CM

## 2018-06-22 MED ORDER — GUAIFENESIN-CODEINE 100-10 MG/5ML PO SOLN: 10.0000 mL | Freq: Three times a day (TID) | ORAL | 0 refills | Status: DC | PRN

## 2018-06-22 MED ORDER — ATORVASTATIN CALCIUM 40 MG PO TABS: 40.0000 mg | ORAL_TABLET | Freq: Every day | ORAL | 3 refills | Status: DC

## 2018-06-22 MED FILL — ATORVASTATIN 40 MG TABLET: 40 | 90 days supply | Qty: 90 | Fill #0

## 2018-06-22 MED FILL — GUAIATUSSIN AC LIQUID: 100-10 | 4 days supply | Qty: 120 | Fill #0

## 2018-06-22 NOTE — Progress Notes (Signed)
Russell Cox DOB: 03-19-1951 Encounter date: 06/22/2018  This is a 68 y.o. male who presents with Chief Complaint  Patient presents with  . Recurrent Skin Infections    left shoulder, possible mersa, showed un 3 weeks ago    History of present illness:  Started as little pimple but got red, swollen quick so went to urgent care. No one was on staff to drain this. Not had anything like this in past. Very painful initially. Little drainage. Healing up with doxycycline that he was given on Thursday night.   Was sick on Christmas with bad cough (exposed to son that was sick) - started with fever, chills, then with hacking/nagging cough. Then at urgent care the other day told them about cough. Given guaif/codeine but rx for once daily. Secretions from nose are thicker and he is blowing nose out more; feels like he is getting more phlegm out at this point.    BP has been a little higher but he has been taking more over the counter medications which he felt was contributing to elevation. Lowest bp he has seen at home was 160.    Allergies  Allergen Reactions  . Bee Venom Anaphylaxis  . Penicillins Hives   Current Meds  Medication Sig  . aspirin 81 MG tablet Take 1 tablet (81 mg total) daily by mouth.  Marland Kitchen atorvastatin (LIPITOR) 40 MG tablet TAKE 1 TABLET BY MOUTH ONCE DAILY *NEED TO SCHEDULE PHYSICAL*  . losartan (COZAAR) 50 MG tablet Take 1.5 tablets (75 mg total) by mouth daily.  . sildenafil (VIAGRA) 100 MG tablet TAKE 1 TABLET BY MOUTH WHEN NEEDED FOR ERECTILE DYSFUNCTION   Current Facility-Administered Medications for the 06/22/18 encounter (Office Visit) with Caren Macadam, MD  Medication  . 0.9 %  sodium chloride infusion    Review of Systems  Objective:  BP (!) 142/80 (BP Location: Left Arm, Patient Position: Sitting, Cuff Size: Normal)   Pulse 86   Temp 98.5 F (36.9 C) (Oral)   Wt 176 lb 11.2 oz (80.2 kg)   SpO2 96%   BMI 24.64 kg/m   Weight: 176 lb 11.2 oz (80.2  kg)   BP Readings from Last 3 Encounters:  06/22/18 (!) 142/80  05/14/18 (!) 160/84  05/01/18 140/81   Wt Readings from Last 3 Encounters:  06/22/18 176 lb 11.2 oz (80.2 kg)  05/14/18 179 lb 1.6 oz (81.2 kg)  05/01/18 170 lb (77.1 kg)    Physical Exam Constitutional:      General: He is not in acute distress.    Appearance: He is well-developed.  HENT:     Right Ear: Tympanic membrane, ear canal and external ear normal.     Left Ear: Tympanic membrane, ear canal and external ear normal.     Nose: Nose normal.     Mouth/Throat:     Mouth: Mucous membranes are moist.     Pharynx: Oropharynx is clear. Uvula midline. No pharyngeal swelling or posterior oropharyngeal erythema.     Tonsils: No tonsillar exudate.  Cardiovascular:     Rate and Rhythm: Normal rate and regular rhythm.     Heart sounds: Normal heart sounds. No murmur. No friction rub.  Pulmonary:     Effort: Pulmonary effort is normal. No respiratory distress.     Breath sounds: Normal breath sounds. No wheezing or rales.  Musculoskeletal:     Right lower leg: No edema.     Left lower leg: No edema.  Skin:  Comments: There is a resolving abscess left posterior shoulder approx 1.25cm in diameter. Light erythema. Central scaling/scab. No fluctuance, no warmth, no tenderness.   Neurological:     Mental Status: He is alert and oriented to person, place, and time.  Psychiatric:        Behavior: Behavior normal.     Assessment/Plan 1. Cough Lungs sound clear on exam; let me know if any worsening of symptoms. On doxy for infection which will cover for sinus/lung infection as well. Refilled cough syrup for him since he is getting sx relief with this.  - guaiFENesin-codeine 100-10 MG/5ML syrup; Take 10 mLs by mouth 3 (three) times daily as needed for cough.  Dispense: 120 mL; Refill: 0  2. Abscess Resolving on doxy; has drained on own. Return if any worsening of redness, tenderness.    3. Tobacco abuse Discussed  again with him effects of smoking and benefits of quitting (this time as it relates to elevated blood pressure).   4. HTN: improved today; cont to monitor at home. Will update me in 2 weeks time. Currently taking 75mg  losartan daily.   Return if symptoms worsen or fail to improve.    [name], MD

## 2018-07-17 MED FILL — LOSARTAN POTASSIUM 50 MG TA: 50 | 30 days supply | Qty: 45 | Fill #1

## 2018-08-10 DIAGNOSIS — M5386 Other specified dorsopathies, lumbar region: Secondary | ICD-10-CM | POA: Diagnosis not present

## 2018-08-10 DIAGNOSIS — M9904 Segmental and somatic dysfunction of sacral region: Secondary | ICD-10-CM | POA: Diagnosis not present

## 2018-08-10 DIAGNOSIS — M9902 Segmental and somatic dysfunction of thoracic region: Secondary | ICD-10-CM | POA: Diagnosis not present

## 2018-08-10 DIAGNOSIS — M5137 Other intervertebral disc degeneration, lumbosacral region: Secondary | ICD-10-CM | POA: Diagnosis not present

## 2018-08-10 DIAGNOSIS — S29012A Strain of muscle and tendon of back wall of thorax, initial encounter: Secondary | ICD-10-CM | POA: Diagnosis not present

## 2018-08-10 DIAGNOSIS — M9903 Segmental and somatic dysfunction of lumbar region: Secondary | ICD-10-CM | POA: Diagnosis not present

## 2018-08-10 MED FILL — LOSARTAN POTASSIUM 50 MG TA: 50 | 30 days supply | Qty: 45 | Fill #2

## 2018-08-12 DIAGNOSIS — M9902 Segmental and somatic dysfunction of thoracic region: Secondary | ICD-10-CM | POA: Diagnosis not present

## 2018-08-12 DIAGNOSIS — S29012A Strain of muscle and tendon of back wall of thorax, initial encounter: Secondary | ICD-10-CM | POA: Diagnosis not present

## 2018-08-12 DIAGNOSIS — M9903 Segmental and somatic dysfunction of lumbar region: Secondary | ICD-10-CM | POA: Diagnosis not present

## 2018-08-12 DIAGNOSIS — M5137 Other intervertebral disc degeneration, lumbosacral region: Secondary | ICD-10-CM | POA: Diagnosis not present

## 2018-08-12 DIAGNOSIS — M9904 Segmental and somatic dysfunction of sacral region: Secondary | ICD-10-CM | POA: Diagnosis not present

## 2018-08-12 DIAGNOSIS — M5386 Other specified dorsopathies, lumbar region: Secondary | ICD-10-CM | POA: Diagnosis not present

## 2018-08-13 DIAGNOSIS — S29012A Strain of muscle and tendon of back wall of thorax, initial encounter: Secondary | ICD-10-CM | POA: Diagnosis not present

## 2018-08-13 DIAGNOSIS — M9902 Segmental and somatic dysfunction of thoracic region: Secondary | ICD-10-CM | POA: Diagnosis not present

## 2018-08-13 DIAGNOSIS — M9903 Segmental and somatic dysfunction of lumbar region: Secondary | ICD-10-CM | POA: Diagnosis not present

## 2018-08-13 DIAGNOSIS — M5137 Other intervertebral disc degeneration, lumbosacral region: Secondary | ICD-10-CM | POA: Diagnosis not present

## 2018-08-13 DIAGNOSIS — M5386 Other specified dorsopathies, lumbar region: Secondary | ICD-10-CM | POA: Diagnosis not present

## 2018-08-13 DIAGNOSIS — M9904 Segmental and somatic dysfunction of sacral region: Secondary | ICD-10-CM | POA: Diagnosis not present

## 2018-08-17 DIAGNOSIS — M9903 Segmental and somatic dysfunction of lumbar region: Secondary | ICD-10-CM | POA: Diagnosis not present

## 2018-08-17 DIAGNOSIS — S29012A Strain of muscle and tendon of back wall of thorax, initial encounter: Secondary | ICD-10-CM | POA: Diagnosis not present

## 2018-08-17 DIAGNOSIS — M5386 Other specified dorsopathies, lumbar region: Secondary | ICD-10-CM | POA: Diagnosis not present

## 2018-08-17 DIAGNOSIS — M5137 Other intervertebral disc degeneration, lumbosacral region: Secondary | ICD-10-CM | POA: Diagnosis not present

## 2018-08-17 DIAGNOSIS — M9902 Segmental and somatic dysfunction of thoracic region: Secondary | ICD-10-CM | POA: Diagnosis not present

## 2018-08-17 DIAGNOSIS — M9904 Segmental and somatic dysfunction of sacral region: Secondary | ICD-10-CM | POA: Diagnosis not present

## 2018-08-19 DIAGNOSIS — M9903 Segmental and somatic dysfunction of lumbar region: Secondary | ICD-10-CM | POA: Diagnosis not present

## 2018-08-19 DIAGNOSIS — M5137 Other intervertebral disc degeneration, lumbosacral region: Secondary | ICD-10-CM | POA: Diagnosis not present

## 2018-08-19 DIAGNOSIS — M5386 Other specified dorsopathies, lumbar region: Secondary | ICD-10-CM | POA: Diagnosis not present

## 2018-08-19 DIAGNOSIS — S29012A Strain of muscle and tendon of back wall of thorax, initial encounter: Secondary | ICD-10-CM | POA: Diagnosis not present

## 2018-08-19 DIAGNOSIS — M9904 Segmental and somatic dysfunction of sacral region: Secondary | ICD-10-CM | POA: Diagnosis not present

## 2018-08-19 DIAGNOSIS — M9902 Segmental and somatic dysfunction of thoracic region: Secondary | ICD-10-CM | POA: Diagnosis not present

## 2018-08-21 DIAGNOSIS — M9902 Segmental and somatic dysfunction of thoracic region: Secondary | ICD-10-CM | POA: Diagnosis not present

## 2018-08-21 DIAGNOSIS — M9904 Segmental and somatic dysfunction of sacral region: Secondary | ICD-10-CM | POA: Diagnosis not present

## 2018-08-21 DIAGNOSIS — M9903 Segmental and somatic dysfunction of lumbar region: Secondary | ICD-10-CM | POA: Diagnosis not present

## 2018-08-21 DIAGNOSIS — S29012A Strain of muscle and tendon of back wall of thorax, initial encounter: Secondary | ICD-10-CM | POA: Diagnosis not present

## 2018-08-21 DIAGNOSIS — M5137 Other intervertebral disc degeneration, lumbosacral region: Secondary | ICD-10-CM | POA: Diagnosis not present

## 2018-08-21 DIAGNOSIS — M5386 Other specified dorsopathies, lumbar region: Secondary | ICD-10-CM | POA: Diagnosis not present

## 2018-08-24 DIAGNOSIS — M9903 Segmental and somatic dysfunction of lumbar region: Secondary | ICD-10-CM | POA: Diagnosis not present

## 2018-08-24 DIAGNOSIS — S29012A Strain of muscle and tendon of back wall of thorax, initial encounter: Secondary | ICD-10-CM | POA: Diagnosis not present

## 2018-08-24 DIAGNOSIS — M5386 Other specified dorsopathies, lumbar region: Secondary | ICD-10-CM | POA: Diagnosis not present

## 2018-08-24 DIAGNOSIS — M5137 Other intervertebral disc degeneration, lumbosacral region: Secondary | ICD-10-CM | POA: Diagnosis not present

## 2018-08-24 DIAGNOSIS — M9902 Segmental and somatic dysfunction of thoracic region: Secondary | ICD-10-CM | POA: Diagnosis not present

## 2018-08-24 DIAGNOSIS — M9904 Segmental and somatic dysfunction of sacral region: Secondary | ICD-10-CM | POA: Diagnosis not present

## 2018-09-08 MED FILL — LOSARTAN POTASSIUM 50 MG TA: 50 | 30 days supply | Qty: 45 | Fill #3

## 2018-09-30 MED FILL — ATORVASTATIN 40 MG TABLET: 40 | 90 days supply | Qty: 90 | Fill #1

## 2018-10-12 MED FILL — LOSARTAN POTASSIUM 50 MG TA: 50 | 30 days supply | Qty: 45 | Fill #4

## 2018-11-09 MED FILL — LOSARTAN POTASSIUM 50 MG TA: 50 | 30 days supply | Qty: 45 | Fill #5

## 2018-12-07 DIAGNOSIS — L821 Other seborrheic keratosis: Secondary | ICD-10-CM | POA: Diagnosis not present

## 2018-12-07 DIAGNOSIS — D229 Melanocytic nevi, unspecified: Secondary | ICD-10-CM | POA: Diagnosis not present

## 2018-12-08 MED FILL — SHINGRIX 50 MCG SUS: 50 | 1 days supply | Qty: 1 | Fill #1

## 2018-12-14 MED FILL — LOSARTAN POTASSIUM 50 MG TA: 50 | 30 days supply | Qty: 30 | Fill #0

## 2018-12-15 ENCOUNTER — Other Ambulatory Visit: Payer: Self-pay | Admitting: Family Medicine

## 2019-01-05 MED FILL — ATORVASTATIN 40 MG TABLET: 40 | 90 days supply | Qty: 90 | Fill #2

## 2019-01-07 MED FILL — LOSARTAN POTASSIUM 50 MG TA: 50 | 30 days supply | Qty: 30 | Fill #0

## 2019-02-01 MED FILL — LOSARTAN POTASSIUM 50 MG TA: 50 | 30 days supply | Qty: 30 | Fill #1

## 2019-02-23 ENCOUNTER — Other Ambulatory Visit: Payer: Self-pay | Admitting: Family Medicine

## 2019-02-23 NOTE — Telephone Encounter (Signed)
Copied from [place] [id number]. Topic: Quick Communication - Rx Refill/Question >> Feb 23, 2019 12:43 PM Izola Price, [place] A wrote: Medication: losartan (COZAAR) 50 MG tablet (Patient stated that he was under the assumption that his dosge increased for medication. Patient is out of medication and has began taking his spouses medication. Patient is  requesting medication be sent to pharmacy.)  Has the patient contacted their pharmacy? Yes (Agent: If no, request that the patient contact the pharmacy for the refill.) (Agent: If yes, when and what did the pharmacy advise?)Contact PCP  Preferred Pharmacy (with phone number or street name): [place], [place] - [phone] (Phone) [phone] (Fax)    Agent: Please be advised that RX refills may take up to 3 business days. We ask that you follow-up with your pharmacy.

## 2019-02-23 NOTE — Telephone Encounter (Signed)
Requested medication (s) are due for refill today: yes  Requested medication (s) are on the active medication list: yes  Last refill:  12/15/2018  Future visit scheduled: no  Notes to clinic:  Medication: losartan (COZAAR) 50 MG tablet (Patient stated that he was under the assumption that his dosge increased for medication. Patient is out of medication and has began taking his spouses medication. Patient is  requesting medication be sent to pharmacy  Requested Prescriptions  Pending Prescriptions Disp Refills   losartan (COZAAR) 50 MG tablet 30 tablet 5    Sig: Take 1 tablet (50 mg total) by mouth daily.     Cardiovascular:  Angiotensin Receptor Blockers Failed - 02/23/2019 12:47 PM      Failed - Cr in normal range and within 180 days    Creatinine, Ser  Date Value Ref Range Status  05/14/2018 1.10 0.40 - 1.50 mg/dL Final         Failed - K in normal range and within 180 days    Potassium  Date Value Ref Range Status  05/14/2018 5.2 (H) 3.5 - 5.1 mEq/L Final         Failed - Last BP in normal range    BP Readings from Last 1 Encounters:  06/22/18 (!) 142/80         Failed - Valid encounter within last 6 months    Recent Outpatient Visits          8 months ago Cough   [facility] [place], MD   9 months ago Essential hypertension   Therapist, music at [facility], Steele Berg, MD   1 year ago Need for influenza vaccination   Therapist, music at [facility], Doretha Sou, MD   3 years ago Building services engineer at [facility], Doretha Sou, MD   4 years ago Tobacco abuse   Therapist, music at [facility], Doretha Sou, MD      Future Appointments            In 1 week Ethlyn Gallery, Steele Berg, MD [facility] [place], [place] - Patient is not pregnant

## 2019-02-24 ENCOUNTER — Other Ambulatory Visit: Payer: Self-pay | Admitting: Family Medicine

## 2019-02-24 MED ORDER — LOSARTAN POTASSIUM 50 MG PO TABS: 75.0000 mg | ORAL_TABLET | Freq: Every day | ORAL | 1 refills | Status: DC

## 2019-02-24 MED FILL — LOSARTAN POTASSIUM 50 MG TA: 50 | 30 days supply | Qty: 45 | Fill #0

## 2019-02-24 NOTE — Telephone Encounter (Signed)
Caller name: Brayton Layman  Relation to pt: pharmacist  Call back number: [phone]  Pharmacy: [place], [place] - [address] [phone] (Phone) [phone] (Fax)     Reason for call:  Pharmacist checking on the status of losartan (COZAAR) patient states PCP increased from 50 MG tablet to 75 MG , please advise

## 2019-03-02 ENCOUNTER — Other Ambulatory Visit: Payer: Self-pay

## 2019-03-02 ENCOUNTER — Ambulatory Visit (INDEPENDENT_AMBULATORY_CARE_PROVIDER_SITE_OTHER)
Admission: RE | Admit: 2019-03-02 | Discharge: 2019-03-02 | Disposition: A | Discharge status: Home / Self Care | Payer: PPO | Source: Ambulatory Visit | Attending: Acute Care | Admitting: Acute Care

## 2019-03-02 DIAGNOSIS — Z87891 Personal history of nicotine dependence: Secondary | ICD-10-CM | POA: Diagnosis not present

## 2019-03-02 DIAGNOSIS — Z122 Encounter for screening for malignant neoplasm of respiratory organs: Secondary | ICD-10-CM

## 2019-03-02 DIAGNOSIS — F17210 Nicotine dependence, cigarettes, uncomplicated: Secondary | ICD-10-CM | POA: Diagnosis not present

## 2019-03-03 ENCOUNTER — Ambulatory Visit (INDEPENDENT_AMBULATORY_CARE_PROVIDER_SITE_OTHER): Payer: PPO | Admitting: Family Medicine

## 2019-03-03 ENCOUNTER — Encounter: Payer: Self-pay | Admitting: Gastroenterology

## 2019-03-03 ENCOUNTER — Encounter: Payer: Self-pay | Admitting: Family Medicine

## 2019-03-03 VITALS — BP 150/80 | HR 91 | Temp 97.6°F | Ht 72.0 in | Wt 170.4 lb

## 2019-03-03 DIAGNOSIS — N529 Male erectile dysfunction, unspecified: Secondary | ICD-10-CM | POA: Diagnosis not present

## 2019-03-03 DIAGNOSIS — Z91030 Bee allergy status: Secondary | ICD-10-CM | POA: Diagnosis not present

## 2019-03-03 DIAGNOSIS — I7789 Other specified disorders of arteries and arterioles: Secondary | ICD-10-CM | POA: Diagnosis not present

## 2019-03-03 DIAGNOSIS — Z23 Encounter for immunization: Secondary | ICD-10-CM | POA: Diagnosis not present

## 2019-03-03 DIAGNOSIS — Z0000 Encounter for general adult medical examination without abnormal findings: Secondary | ICD-10-CM

## 2019-03-03 DIAGNOSIS — E785 Hyperlipidemia, unspecified: Secondary | ICD-10-CM | POA: Diagnosis not present

## 2019-03-03 DIAGNOSIS — R7302 Impaired glucose tolerance (oral): Secondary | ICD-10-CM | POA: Diagnosis not present

## 2019-03-03 DIAGNOSIS — R059 Cough, unspecified: Secondary | ICD-10-CM

## 2019-03-03 DIAGNOSIS — I10 Essential (primary) hypertension: Secondary | ICD-10-CM

## 2019-03-03 DIAGNOSIS — Z1211 Encounter for screening for malignant neoplasm of colon: Secondary | ICD-10-CM | POA: Diagnosis not present

## 2019-03-03 DIAGNOSIS — R05 Cough: Secondary | ICD-10-CM | POA: Diagnosis not present

## 2019-03-03 DIAGNOSIS — E875 Hyperkalemia: Secondary | ICD-10-CM

## 2019-03-03 LAB — CBC WITH DIFFERENTIAL/PLATELET
Basophils Absolute: 0.1 K/uL (ref 0.0–0.1)
Basophils Relative: 1 % (ref 0.0–3.0)
Eosinophils Absolute: 0.2 K/uL (ref 0.0–0.7)
Eosinophils Relative: 3.9 % (ref 0.0–5.0)
HCT: 47.1 % (ref 39.0–52.0)
Hemoglobin: 15.9 g/dL (ref 13.0–17.0)
Lymphocytes Relative: 25.4 % (ref 12.0–46.0)
Lymphs Abs: 1.6 K/uL (ref 0.7–4.0)
MCHC: 33.8 g/dL (ref 30.0–36.0)
MCV: 98.8 fl (ref 78.0–100.0)
Monocytes Absolute: 0.6 K/uL (ref 0.1–1.0)
Monocytes Relative: 10.4 % (ref 3.0–12.0)
Neutro Abs: 3.6 K/uL (ref 1.4–7.7)
Neutrophils Relative %: 59.3 % (ref 43.0–77.0)
Platelets: 207 K/uL (ref 150.0–400.0)
RBC: 4.77 Mil/uL (ref 4.22–5.81)
RDW: 13.7 % (ref 11.5–15.5)
WBC: 6.1 K/uL (ref 4.0–10.5)

## 2019-03-03 LAB — SARS-COV-2 IGG: SARS-COV-2 IgG: 7.79

## 2019-03-03 LAB — COMPREHENSIVE METABOLIC PANEL
ALT: 21 U/L (ref 0–53)
AST: 23 U/L (ref 0–37)
Albumin: 4.6 g/dL (ref 3.5–5.2)
Alkaline Phosphatase: 64 U/L (ref 39–117)
BUN: 20 mg/dL (ref 6–23)
CO2: 27 mEq/L (ref 19–32)
Calcium: 10.4 mg/dL (ref 8.4–10.5)
Chloride: 100 mEq/L (ref 96–112)
Creatinine, Ser: 1.14 mg/dL (ref 0.40–1.50)
GFR: 63.55 mL/min (ref >60.00)
Glucose, Bld: 98 mg/dL (ref 70–99)
Potassium: 5.3 mEq/L — ABNORMAL HIGH (ref 3.5–5.1)
Sodium: 136 mEq/L (ref 135–145)
Total Bilirubin: 0.5 mg/dL (ref 0.2–1.2)
Total Protein: 7.3 g/dL (ref 6.0–8.3)

## 2019-03-03 LAB — HEMOGLOBIN A1C: Hgb A1c MFr Bld: 5.9 % (ref 4.6–6.5)

## 2019-03-03 LAB — LIPID PANEL
Cholesterol: 191 mg/dL (ref 0–200)
HDL: 70.6 mg/dL (ref >39.00)
LDL Cholesterol: 108 mg/dL — ABNORMAL HIGH (ref 0–99)
NonHDL: 120.6
Total CHOL/HDL Ratio: 3
Triglycerides: 61 mg/dL (ref 0.0–149.0)
VLDL: 12.2 mg/dL (ref 0.0–40.0)

## 2019-03-03 MED ORDER — EPINEPHRINE 0.3 MG/0.3ML IJ SOAJ: 0.3000 mg | Freq: Once | INTRAMUSCULAR | 3 refills | Status: AC

## 2019-03-03 MED ORDER — SILDENAFIL CITRATE 20 MG PO TABS: ORAL_TABLET | ORAL | 5 refills | Status: DC

## 2019-03-03 MED ORDER — LOSARTAN POTASSIUM-HCTZ 100-12.5 MG PO TABS: 1.0000 | ORAL_TABLET | Freq: Every day | ORAL | 3 refills | Status: DC

## 2019-03-03 MED FILL — LOSARTAN-HCTZ 100-12.5 MG T: 100-12.5 | 30 days supply | Qty: 30 | Fill #0

## 2019-03-03 NOTE — Patient Instructions (Signed)
Let me know if you want to try medication to help with quitting smoking.

## 2019-03-03 NOTE — Progress Notes (Signed)
Russell Cox DOB: Dec 02, 1950 Encounter date: 03/03/2019  This is a 68 y.o. male who presents for complete physical   History of present illness/Additional concerns: Last bloodwork was 05/2018  Feeling well. No specific concerns today.   HTN: has been checking at home; still high there. Has been taking the losartan 75mg  daily.  Hyperglycemia: has been stable.  HL: still doing lipitor; no problems with this. No muscle aches or pains.   Repeat colonoscopy due 03/2019: hasn't heard from GI yet regarding this.  Follows with Dr. Denna Haggard regularly [facility] yesterday for lung screening.   Tried chantix for quitting smoking but it really affects his personality (in negative way). Not ready to try something else right now.   Stays very active. Working and doing things outside. No specific exercise.  States had AAA screening done with Dr. Raliegh Ip and there was slight aortic enlargement; wondering about getting this repeated.   Past Medical History:  Diagnosis Date  . Cataract    removed bilaterally  . Cigarette smoker   . COPD (chronic obstructive pulmonary disease) ([place])   . Hyperlipidemia   . Hypertension   . SINUSITIS, ACUTE MAXILLARY 02/24/2007  . URI 05/28/2007   Past Surgical History:  Procedure Laterality Date  . CATARACT EXTRACTION W/ INTRAOCULAR LENS IMPLANT Bilateral    left  . COLONOSCOPY  03/2016   repeat in 5 years  . ELBOW SURGERY  1988   tennis  . hemmorrhoidectomy  1988  . POLYPECTOMY    . SHOULDER ARTHROSCOPY W/ ACROMIAL REPAIR     left   Allergies  Allergen Reactions  . Bee Venom Anaphylaxis  . Penicillins Hives   Current Meds  Medication Sig  . aspirin 81 MG tablet Take 1 tablet (81 mg total) daily by mouth.  Marland Kitchen atorvastatin (LIPITOR) 40 MG tablet Take 1 tablet (40 mg total) by mouth daily.  Marland Kitchen losartan (COZAAR) 50 MG tablet Take 1.5 tablets (75 mg total) by mouth daily.  . sildenafil (VIAGRA) 100 MG tablet TAKE 1 TABLET BY MOUTH WHEN NEEDED FOR ERECTILE DYSFUNCTION    Current Facility-Administered Medications for the 03/03/19 encounter (Office Visit) with Caren Macadam, MD  Medication  . 0.9 %  sodium chloride infusion   Social History   Tobacco Use  . Smoking status: Current Every Day Smoker    Packs/day: 1.00    Years: 54.00    Pack years: 54.00    Types: Cigarettes  . Smokeless tobacco: Never Used  Substance Use Topics  . Alcohol use: Yes    Comment: socially   Family History  Problem Relation Age of Onset  . Cancer Mother        unknown what type  . Colon cancer Mother   . Arthritis Mother   . Heart disease Father   . Cancer Father        thyroid  . Hypertension Sister   . Hypertension Brother   . Bladder Cancer Brother 41  . Diabetes Maternal Grandmother   . Cancer Maternal Grandfather        lung  . Colon polyps Neg Hx   . Esophageal cancer Neg Hx   . Rectal cancer Neg Hx   . Stomach cancer Neg Hx      Review of Systems  CBC:  Lab Results  Component Value Date   WBC 5.9 05/14/2018   HGB 16.0 05/14/2018   HCT 47.5 05/14/2018   MCHC 33.8 05/14/2018   RDW 12.9 05/14/2018   PLT 228.0 05/14/2018  CMP: Lab Results  Component Value Date   NA 136 05/14/2018   K 5.2 (H) 05/14/2018   CL 101 05/14/2018   CO2 27 05/14/2018   GLUCOSE 108 (H) 05/14/2018   BUN 17 05/14/2018   CREATININE 1.10 05/14/2018   GFRAA 111 12/21/2007   CALCIUM 9.8 05/14/2018   PROT 7.5 05/14/2018   BILITOT 0.7 05/14/2018   ALKPHOS 64 05/14/2018   ALT 21 05/14/2018   AST 21 05/14/2018   LIPID: Lab Results  Component Value Date   CHOL 195 05/14/2018   TRIG 71.0 05/14/2018   HDL 70.50 05/14/2018   LDLCALC 111 (H) 05/14/2018    Objective:  BP (!) 150/80 (BP Location: Left Arm, Patient Position: Sitting, Cuff Size: Normal)   Pulse 91   Temp 97.6 F (36.4 C) (Temporal)   Ht 6' (1.829 m)   Wt 170 lb 6.4 oz (77.3 kg)   BMI 23.11 kg/m   Weight: 170 lb 6.4 oz (77.3 kg)   BP Readings from Last 3 Encounters:  03/03/19 (!)  150/80  06/22/18 (!) 142/80  05/14/18 (!) 160/84   Wt Readings from Last 3 Encounters:  03/03/19 170 lb 6.4 oz (77.3 kg)  06/22/18 176 lb 11.2 oz (80.2 kg)  05/14/18 179 lb 1.6 oz (81.2 kg)    Physical Exam Constitutional:      General: He is not in acute distress.    Appearance: He is well-developed.  HENT:     Head: Normocephalic and atraumatic.     Right Ear: External ear normal.     Left Ear: External ear normal.     Nose: Nose normal.     Mouth/Throat:     Pharynx: No oropharyngeal exudate.  Eyes:     Conjunctiva/sclera: Conjunctivae normal.     Pupils: Pupils are equal, round, and reactive to light.  Neck:     Musculoskeletal: Neck supple.     Thyroid: No thyromegaly.  Cardiovascular:     Rate and Rhythm: Normal rate and regular rhythm.     Heart sounds: Normal heart sounds. No murmur. No friction rub. No gallop.   Pulmonary:     Effort: Pulmonary effort is normal. No respiratory distress.     Breath sounds: Normal breath sounds. No stridor. No wheezing or rales.  Abdominal:     General: Bowel sounds are normal.     Palpations: Abdomen is soft.     Hernia: A hernia is present. Hernia is present in the umbilical area.  Musculoskeletal: Normal range of motion.  Skin:    General: Skin is warm and dry.     Comments: Skin exam deferred since he sees dermatology  Neurological:     Mental Status: He is alert and oriented to person, place, and time.  Psychiatric:        Behavior: Behavior normal.        Thought Content: Thought content normal.        Judgment: Judgment normal.     Assessment/Plan: Health Maintenance Due  Topic Date Due  . INFLUENZA VACCINE  01/09/2019   Health Maintenance reviewed.  1. Preventative health care We reviewed consideration for quitting smoking.  He is not ready to quit yet.  We discussed considering Wellbutrin rather than Chantix since this would likely be less expensive for him.  He states he will consider.  2. Essential  hypertension Suboptimal control of blood pressure.  We are going to change medication to losartan-hydrochlorothiazide.  Follow-up in 1 to 2 months. -  CBC with Differential/Platelet; Future - Comprehensive metabolic panel; Future - losartan-hydrochlorothiazide (HYZAAR) 100-12.5 MG tablet; Take 1 tablet by mouth daily.  Dispense: 90 tablet; Refill: 3  3. Impaired glucose tolerance - Hemoglobin A1c; Future  4. Dyslipidemia Continue Lipitor - Lipid panel; Future  5. 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  Follow-up pending results. - VAS [place] AAA DUPLEX; Future  6. Bee sting allergy EpiPen refilled. - EPINEPHrine 0.3 mg/0.3 mL IJ SOAJ injection; Inject 0.3 mLs (0.3 mg total) into the muscle once for 1 dose.  Dispense: 0.3 mL; Refill: 3  7. Erectile dysfunction, unspecified erectile dysfunction type Sildenafil does help.  Usually uses 50 to 100 mg.  Recommend to try generic since this will be more affordable for him. - sildenafil (REVATIO) 20 MG tablet; Take 1-5 tablets PO prn erectile dysfunction  Dispense: 30 tablet; Refill: 5  8. Colon cancer screening He is due for repeat colonoscopy. - Ambulatory referral to Gastroenterology  9. Cough Was ill earlier in the year, interested to see if he had coronavirus at that time.  We will check this along with blood work today. - SARS-COV-2 IgG  Return in about 1 month (around 04/02/2019) for Chronic condition visit - doxy visit is ok.  [name], MD

## 2019-03-04 ENCOUNTER — Telehealth: Payer: Self-pay | Admitting: Acute Care

## 2019-03-04 DIAGNOSIS — F17210 Nicotine dependence, cigarettes, uncomplicated: Secondary | ICD-10-CM

## 2019-03-04 DIAGNOSIS — Z87891 Personal history of nicotine dependence: Secondary | ICD-10-CM

## 2019-03-04 DIAGNOSIS — Z122 Encounter for screening for malignant neoplasm of respiratory organs: Secondary | ICD-10-CM

## 2019-03-04 NOTE — Telephone Encounter (Signed)
Pt informed of CT results per Sarah Groce, NP.  PT verbalized understanding.  Copy sent to PCP.  Order placed for 1 yr f/u CT.  

## 2019-03-05 NOTE — Addendum Note (Signed)
Addended by: Lahoma Crocker A on: 03/05/2019 10:30 AM   Modules accepted: Orders

## 2019-03-12 ENCOUNTER — Ambulatory Visit (HOSPITAL_BASED_OUTPATIENT_CLINIC_OR_DEPARTMENT_OTHER)
Admission: RE | Admit: 2019-03-12 | Discharge: 2019-03-12 | Disposition: A | Discharge status: Home / Self Care | Payer: PPO | Source: Ambulatory Visit | Attending: Family Medicine | Admitting: Family Medicine

## 2019-03-12 ENCOUNTER — Other Ambulatory Visit: Payer: Self-pay

## 2019-03-12 DIAGNOSIS — I7789 Other specified disorders of arteries and arterioles: Secondary | ICD-10-CM | POA: Diagnosis not present

## 2019-03-12 NOTE — Progress Notes (Signed)
Abdominl Aorta doppler    03/12/19 Cardell Peach RDCS, RVT

## 2019-03-17 ENCOUNTER — Encounter: Payer: Self-pay | Admitting: Family Medicine

## 2019-03-17 ENCOUNTER — Other Ambulatory Visit: Payer: Self-pay | Admitting: Family Medicine

## 2019-03-17 MED ORDER — CLINDAMYCIN HCL 300 MG PO CAPS: 300.0000 mg | ORAL_CAPSULE | Freq: Three times a day (TID) | ORAL | 0 refills | Status: AC

## 2019-03-17 MED FILL — CLINDAMYCIN HCL 300 MG CAPS: 300 | 5 days supply | Qty: 15 | Fill #0

## 2019-03-22 ENCOUNTER — Other Ambulatory Visit: Payer: Self-pay

## 2019-03-22 ENCOUNTER — Other Ambulatory Visit: Payer: PPO

## 2019-03-22 ENCOUNTER — Ambulatory Visit (AMBULATORY_SURGERY_CENTER): Payer: Self-pay | Admitting: *Deleted

## 2019-03-22 VITALS — Temp 97.1°F | Ht 72.0 in | Wt 170.8 lb

## 2019-03-22 DIAGNOSIS — Z86010 Personal history of colonic polyps: Secondary | ICD-10-CM

## 2019-03-22 MED ORDER — PEG 3350-KCL-NA BICARB-NACL 420 G PO SOLR: 4000.0000 mL | Freq: Once | ORAL | 0 refills | Status: AC

## 2019-03-22 MED FILL — PEG-3350 SOLUTION: 420 | 1 days supply | Qty: 4000 | Fill #0

## 2019-03-22 NOTE — Progress Notes (Signed)
No egg or soy allergy known to patient  No issues with past sedation with any surgeries  or procedures, no intubation problems  No diet pills per patient No home 02 use per patient  No blood thinners per patient  Pt denies issues with constipation  No A fib or A flutter  EMMI video sent to pt's e mail   Pt states he was symptomatic of COVID December 2019 thru February 2020- 2 weeks ago tested Positive for the covid antibodies- per J Curt[facility] Director, pt does not need COVID  test    Due to the COVID-19 pandemic we are asking patients to follow these guidelines. Please only bring one care partner. Please be aware that your care partner may wait in the car in the parking lot or if they feel like they will be too hot to wait in the car, they may wait in the lobby on the 4th floor. All care partners are required to wear a mask the entire time (we do not have any that we can provide them), they need to practice social distancing, and we will do a Covid check for all patient's and care partners when you arrive. Also we will check their temperature and your temperature. If the care partner waits in their car they need to stay in the parking lot the entire time and we will call them on their cell phone when the patient is ready for discharge so they can bring the car to the front of the building. Also all patient's will need to wear a mask into building.

## 2019-03-23 ENCOUNTER — Encounter: Payer: Self-pay | Admitting: Family Medicine

## 2019-03-23 ENCOUNTER — Encounter: Payer: Self-pay | Admitting: Gastroenterology

## 2019-03-23 ENCOUNTER — Other Ambulatory Visit (INDEPENDENT_AMBULATORY_CARE_PROVIDER_SITE_OTHER): Payer: PPO

## 2019-03-23 ENCOUNTER — Ambulatory Visit (INDEPENDENT_AMBULATORY_CARE_PROVIDER_SITE_OTHER): Payer: PPO | Admitting: Family Medicine

## 2019-03-23 VITALS — BP 132/80 | HR 79 | Temp 97.2°F | Ht 72.0 in | Wt 168.1 lb

## 2019-03-23 DIAGNOSIS — M7989 Other specified soft tissue disorders: Secondary | ICD-10-CM

## 2019-03-23 DIAGNOSIS — R221 Localized swelling, mass and lump, neck: Secondary | ICD-10-CM

## 2019-03-23 DIAGNOSIS — E875 Hyperkalemia: Secondary | ICD-10-CM

## 2019-03-23 LAB — BASIC METABOLIC PANEL
BUN: 19 mg/dL (ref 6–23)
CO2: 29 mEq/L (ref 19–32)
Calcium: 10 mg/dL (ref 8.4–10.5)
Chloride: 100 mEq/L (ref 96–112)
Creatinine, Ser: 1.25 mg/dL (ref 0.40–1.50)
GFR: 57.13 mL/min — ABNORMAL LOW (ref >60.00)
Glucose, Bld: 115 mg/dL — ABNORMAL HIGH (ref 70–99)
Potassium: 4.1 mEq/L (ref 3.5–5.1)
Sodium: 136 mEq/L (ref 135–145)

## 2019-03-23 NOTE — Patient Instructions (Signed)
We will set up further imaging of left face mass and you should get a call from imaging.

## 2019-03-23 NOTE — Progress Notes (Signed)
Subjective:     Patient ID: Russell Cox, male   DOB: 03-19-1951, 68 y.o.   MRN: [id number]  HPI   Patient seen with mass just below his left ear and extending posteriorly behind the ear which he first noticed couple weeks ago.  He states this is nonpainful.  He had prescription of clindamycin called in by another provider which he took for 6 days without change.Marland Kitchen  He did not notice any overlying erythema or warmth.  He states in general he feels "well ".  Denies any appetite or weight changes.  No fevers or chills.  No night sweats.  No change is mass size with eating.  He has not noted any neck, supraclavicular, or axillary adenopathy.  Just had recent labs including CBC, comprehensive metabolic panel, lipid panel, A1c which were unremarkable.  Long history of smoking.  Denies any recent cough, dyspnea, abdominal pain, or change in stool habits.  Past Medical History:  Diagnosis Date  . Cataract    removed bilaterally  . Cigarette smoker   . COPD (chronic obstructive pulmonary disease) ([place])   . Hyperlipidemia   . Hypertension   . SINUSITIS, ACUTE MAXILLARY 02/24/2007  . URI 05/28/2007   Past Surgical History:  Procedure Laterality Date  . CATARACT EXTRACTION W/ INTRAOCULAR LENS IMPLANT Bilateral    left  . COLONOSCOPY  03/2016   repeat in 5 years  . ELBOW SURGERY  1988   tennis  . hemmorrhoidectomy  1988  . POLYPECTOMY    . SHOULDER ARTHROSCOPY W/ ACROMIAL REPAIR     left    reports that he has been smoking cigarettes. He has a 54.00 pack-year smoking history. He has never used smokeless tobacco. He reports current alcohol use. He reports that he does not use drugs. family history includes Arthritis in his mother; Bladder Cancer (age of onset: 51) in his brother; Cancer in his father, maternal grandfather, and mother; Colon cancer (age of onset: 80) in his mother; Diabetes in his maternal grandmother; Heart disease in his father; Hypertension in his brother and sister. Allergies   Allergen Reactions  . Bee Venom Anaphylaxis  . Penicillins Hives     Review of Systems  Constitutional: Negative for appetite change, chills, fever and unexpected weight change.  Respiratory: Negative for cough and shortness of breath.   Cardiovascular: Negative for chest pain.  Gastrointestinal: Negative for abdominal pain.  Neurological: Negative for headaches.  Hematological: Negative for adenopathy. Does not bruise/bleed easily.       Objective:   Physical Exam Constitutional:      Appearance: Normal appearance.  HENT:     Right Ear: Ear canal normal.     Left Ear: Ear canal normal.     Mouth/Throat:     Comments: Oropharynx is clear Neck:     Comments: Patient has approximately 4 x 4 centimeter minimally mobile solid feeling mass which is just inferior and posterior to the left ear.  This extends into the parotid region.  Nontender.  No overlying erythema.  No posterior cervical or anterior cervical adenopathy.  No supraclavicular adenopathy. Cardiovascular:     Rate and Rhythm: Normal rate and regular rhythm.  Pulmonary:     Effort: Pulmonary effort is normal.     Breath sounds: Normal breath sounds.  Neurological:     Mental Status: He is alert.        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  Needs further evaluation to rule out lymphoma vs other.    Plan:     - will set up further imaging with CT neck soft tissue with contrast  -will likely need biopsy to further clarify but will get imaging as above first.    Eulas Post MD [facility]

## 2019-04-05 ENCOUNTER — Ambulatory Visit (AMBULATORY_SURGERY_CENTER): Payer: PPO | Admitting: Gastroenterology

## 2019-04-05 ENCOUNTER — Encounter: Payer: Self-pay | Admitting: Gastroenterology

## 2019-04-05 ENCOUNTER — Other Ambulatory Visit: Payer: Self-pay

## 2019-04-05 VITALS — BP 129/82 | HR 72 | Temp 96.6°F | Resp 17 | Ht 72.0 in | Wt 170.8 lb

## 2019-04-05 DIAGNOSIS — I10 Essential (primary) hypertension: Secondary | ICD-10-CM | POA: Diagnosis not present

## 2019-04-05 DIAGNOSIS — D123 Benign neoplasm of transverse colon: Secondary | ICD-10-CM

## 2019-04-05 DIAGNOSIS — Z86010 Personal history of colonic polyps: Secondary | ICD-10-CM

## 2019-04-05 DIAGNOSIS — J449 Chronic obstructive pulmonary disease, unspecified: Secondary | ICD-10-CM | POA: Diagnosis not present

## 2019-04-05 DIAGNOSIS — D120 Benign neoplasm of cecum: Secondary | ICD-10-CM

## 2019-04-05 MED ORDER — SODIUM CHLORIDE 0.9 % IV SOLN: 500.0000 mL | Freq: Once | INTRAVENOUS | Status: DC

## 2019-04-05 MED FILL — ATORVASTATIN 40 MG TABLET: 40 | 90 days supply | Qty: 90 | Fill #3

## 2019-04-05 MED FILL — LOSARTAN-HCTZ 100-12.5 MG T: 100-12.5 | 30 days supply | Qty: 30 | Fill #1

## 2019-04-05 NOTE — Progress Notes (Signed)
Report given to PACU, vss 

## 2019-04-05 NOTE — Progress Notes (Signed)
Called to room to assist during endoscopic procedure.  Patient ID and intended procedure confirmed with present staff. Received instructions for my participation in the procedure from the performing physician.  

## 2019-04-05 NOTE — Progress Notes (Signed)
Pt's states no medical or surgical changes since previsit or office visit.  Temp taken by YF VS taken by [place]

## 2019-04-05 NOTE — Patient Instructions (Signed)
Information on polyps given to you today.  Await pathology results.   YOU HAD AN ENDOSCOPIC PROCEDURE TODAY AT THE [facility]:   Refer to the procedure report that was given to you for any specific questions about what was found during the examination.  If the procedure report does not answer your questions, please call your gastroenterologist to clarify.  If you requested that your care partner not be given the details of your procedure findings, then the procedure report has been included in a sealed envelope for you to review at your convenience later.  YOU SHOULD EXPECT: Some feelings of bloating in the abdomen. Passage of more gas than usual.  Walking can help get rid of the air that was put into your GI tract during the procedure and reduce the bloating. If you had a lower endoscopy (such as a colonoscopy or flexible sigmoidoscopy) you may notice spotting of blood in your stool or on the toilet paper. If you underwent a bowel prep for your procedure, you may not have a normal bowel movement for a few days.  Please Note:  You might notice some irritation and congestion in your nose or some drainage.  This is from the oxygen used during your procedure.  There is no need for concern and it should clear up in a day or so.  SYMPTOMS TO REPORT IMMEDIATELY:   Following lower endoscopy (colonoscopy or flexible sigmoidoscopy):  Excessive amounts of blood in the stool  Significant tenderness or worsening of abdominal pains  Swelling of the abdomen that is new, acute  Fever of 100F or higher   For urgent or emergent issues, a gastroenterologist can be reached at any hour by calling [phone].   DIET:  We do recommend a small meal at first, but then you may proceed to your regular diet.  Drink plenty of fluids but you should avoid alcoholic beverages for 24 hours.  ACTIVITY:  You should plan to take it easy for the rest of today and you should NOT DRIVE or use heavy machinery  until tomorrow (because of the sedation medicines used during the test).    FOLLOW UP: Our staff will call the number listed on your records 48-72 hours following your procedure to check on you and address any questions or concerns that you may have regarding the information given to you following your procedure. If we do not reach you, we will leave a message.  We will attempt to reach you two times.  During this call, we will ask if you have developed any symptoms of COVID 19. If you develop any symptoms (ie: fever, flu-like symptoms, shortness of breath, cough etc.) before then, please call [phone].  If you test positive for Covid 19 in the 2 weeks post procedure, please call and report this information to us.    If any biopsies were taken you will be contacted by phone or by letter within the next 1-3 weeks.  Please call us at [phone] if you have not heard about the biopsies in 3 weeks.    SIGNATURES/CONFIDENTIALITY: You and/or your care partner have signed paperwork which will be entered into your electronic medical record.  These signatures attest to the fact that that the information above on your After Visit Summary has been reviewed and is understood.  Full responsibility of the confidentiality of this discharge information lies with you and/or your care-partner. 

## 2019-04-05 NOTE — Op Note (Signed)
[place] Patient Name: Russell Cox Procedure Date: 04/05/2019 9:31 AM MRN: [id number] Endoscopist: Ladene Artist , MD Age: 68 Referring MD:  Date of Birth: 1951-03-24 Gender: Male Account #: [id number] Procedure:                Colonoscopy Indications:              Surveillance: Personal history of adenomatous                            polyps on last colonoscopy 3 years ago Medicines:                Monitored Anesthesia Care Procedure:                Pre-Anesthesia Assessment:                           - Prior to the procedure, a History and Physical                            was performed, and patient medications and                            allergies were reviewed. The patient's tolerance of                            previous anesthesia was also reviewed. The risks                            and benefits of the procedure and the sedation                            options and risks were discussed with the patient.                            All questions were answered, and informed consent                            was obtained. Prior Anticoagulants: The patient has                            taken no previous anticoagulant or antiplatelet                            agents. ASA Grade Assessment: II - A patient with                            mild systemic disease. After reviewing the risks                            and benefits, the patient was deemed in                            satisfactory condition to undergo the procedure.  After obtaining informed consent, the colonoscope                            was passed under direct vision. Throughout the                            procedure, the patient's blood pressure, pulse, and                            oxygen saturations were monitored continuously. The                            Colonoscope was introduced through the anus and                            advanced to the the cecum,  identified by                            appendiceal orifice and ileocecal valve. The                            ileocecal valve, appendiceal orifice, and rectum                            were photographed. The quality of the bowel                            preparation was good after extensive lavage and                            suctioning. The colonoscopy was performed without                            difficulty. The patient tolerated the procedure                            well. Scope In: 9:41:27 AM Scope Out: 9:58:54 AM Scope Withdrawal Time: 0 hours 14 minutes 42 seconds  Total Procedure Duration: 0 hours 17 minutes 27 seconds  Findings:                 The perianal and digital rectal examinations were                            normal.                           Three sessile polyps were found in the transverse                            colon (2) and cecum (1). The polyps were 7 to 9 mm                            in size. These polyps were removed with a cold  snare. Resection and retrieval were complete.                           The exam was otherwise without abnormality on                            direct and retroflexion views. Complications:            No immediate complications. Estimated blood loss:                            None. Estimated Blood Loss:     Estimated blood loss: none. Impression:               - Three 7 to 9 mm polyps in the transverse colon                            and in the cecum, removed with a cold snare.                            Resected and retrieved.                           - The examination was otherwise normal on direct                            and retroflexion views. Recommendation:           - Repeat colonoscopy in 3 years for surveillance,                            pending pathology review.                           - Patient has a contact number available for                            emergencies.  The signs and symptoms of potential                            delayed complications were discussed with the                            patient. Return to normal activities tomorrow.                            Written discharge instructions were provided to the                            patient.                           - Resume previous diet.                           - Continue present medications.                           -  Await pathology results. Ladene Artist, MD 04/05/2019 10:04:17 AM This report has been signed electronically.

## 2019-04-07 ENCOUNTER — Ambulatory Visit (INDEPENDENT_AMBULATORY_CARE_PROVIDER_SITE_OTHER): Payer: PPO | Admitting: Family Medicine

## 2019-04-07 ENCOUNTER — Encounter: Payer: Self-pay | Admitting: Family Medicine

## 2019-04-07 ENCOUNTER — Other Ambulatory Visit: Payer: Self-pay

## 2019-04-07 ENCOUNTER — Telehealth: Payer: Self-pay | Admitting: *Deleted

## 2019-04-07 VITALS — BP 140/76 | HR 98 | Temp 98.0°F | Ht 72.0 in | Wt 171.9 lb

## 2019-04-07 DIAGNOSIS — M7989 Other specified soft tissue disorders: Secondary | ICD-10-CM

## 2019-04-07 DIAGNOSIS — I10 Essential (primary) hypertension: Secondary | ICD-10-CM

## 2019-04-07 NOTE — Patient Instructions (Addendum)
Please update me on your blood pressures in 2 weeks time.

## 2019-04-07 NOTE — Telephone Encounter (Signed)
  Follow up Call-  Call back number 04/05/2019  Post procedure Call Back phone  # [phone]  Permission to leave phone message Yes  Some recent data might be hidden     Patient questions:  Do you have a fever, pain , or abdominal swelling? No. Pain Score  0 *  Have you tolerated food without any problems? Yes.    Have you been able to return to your normal activities? Yes.    Do you have any questions about your discharge instructions: Diet   No. Medications  No. Follow up visit  No.  Do you have questions or concerns about your Care? No.  Actions: * If pain score is 4 or above: 1. No action needed, pain <4.Have you developed a fever since your procedure? no  2.   Have you had an respiratory symptoms (SOB or cough) since your procedure? no  3.   Have you tested positive for COVID 19 since your procedure no  4.   Have you had any family members/close contacts diagnosed with the COVID 19 since your procedure?  no   If yes to any of these questions please route to Joylene John, RN and Alphonsa Gin, Therapist, sports.

## 2019-04-07 NOTE — Progress Notes (Signed)
  Russell Cox DOB: 1950-08-18 Encounter date: 04/07/2019  This is a 68 y.o. male who presents with Chief Complaint  Patient presents with  . Follow-up    History of present illness: Last visit with me bp was elevated, medication changed to losartan-hctz. Prior to colonoscopy pressure was in low 100's.   Last visit in office with Dr. Elease Hashimoto: CT neck ordered for evaluation of left facial mass in left inferior auricular region near parotid gland. Does feel like mass is shrinking. Just popped up out of nowhere. Not tender. No fever, no chills, no sweats. Appetite is good, feels good.   Colonoscopy completed 10/26.   Allergies  Allergen Reactions  . Bee Venom Anaphylaxis  . Penicillins Hives   Current Meds  Medication Sig  . aspirin 81 MG tablet Take 1 tablet (81 mg total) daily by mouth.  Marland Kitchen atorvastatin (LIPITOR) 40 MG tablet Take 1 tablet (40 mg total) by mouth daily.  Marland Kitchen losartan-hydrochlorothiazide (HYZAAR) 100-12.5 MG tablet Take 1 tablet by mouth daily.  . sildenafil (VIAGRA) 100 MG tablet TAKE 1 TABLET BY MOUTH WHEN NEEDED FOR ERECTILE DYSFUNCTION    Review of Systems  Objective:  BP 140/76 (BP Location: Left Arm, Patient Position: Sitting, Cuff Size: Large)   Pulse 98   Temp 98 F (36.7 C) (Temporal)   Ht 6' (1.829 m)   Wt 171 lb 14.4 oz (78 kg)   SpO2 96%   BMI 23.31 kg/m   Weight: 171 lb 14.4 oz (78 kg)   BP Readings from Last 3 Encounters:  04/07/19 140/76  04/05/19 129/82  03/23/19 132/80   Wt Readings from Last 3 Encounters:  04/07/19 171 lb 14.4 oz (78 kg)  04/05/19 170 lb 12.8 oz (77.5 kg)  03/23/19 168 lb 1.6 oz (76.2 kg)    Physical Exam Constitutional:      General: He is not in acute distress.    Appearance: He is well-developed.  Neck:     Musculoskeletal: Normal range of motion and neck supple.     Thyroid: No thyroid mass, thyromegaly or thyroid tenderness.     Comments: There is palpable, mobile 1cm soft nodule just posterior to ear  lobe left. Not superficial. No tenderness. No other adenopathy appreciated.  Cardiovascular:     Rate and Rhythm: Normal rate and regular rhythm.     Heart sounds: Normal heart sounds. No murmur. No friction rub.  Pulmonary:     Effort: Pulmonary effort is normal. No respiratory distress.     Breath sounds: Normal breath sounds. No wheezing or rales.  Musculoskeletal:     Right lower leg: No edema.     Left lower leg: No edema.  Neurological:     Mental Status: He is alert and oriented to person, place, and time.  Psychiatric:        Behavior: Behavior normal.     Assessment/Plan  1. Essential hypertension bp is improved today. Elevated on check today, and on recheck, but numbers at home and with other providers have been better. Will have him check at home and report back in 1-2 weeks. Follow up pending report.  2. Mass of soft tissue of face Has CT scheduled for next week. Follow up pending this result.  Return for pending blood pressure report.   (hyperkalemia normalized with hctz addition to losartan)   [name], MD

## 2019-04-12 ENCOUNTER — Telehealth: Payer: Self-pay

## 2019-04-12 ENCOUNTER — Other Ambulatory Visit: Payer: Self-pay

## 2019-04-12 ENCOUNTER — Ambulatory Visit (INDEPENDENT_AMBULATORY_CARE_PROVIDER_SITE_OTHER)
Admission: RE | Admit: 2019-04-12 | Discharge: 2019-04-12 | Disposition: A | Discharge status: Home / Self Care | Payer: PPO | Source: Ambulatory Visit | Attending: Family Medicine | Admitting: Family Medicine

## 2019-04-12 DIAGNOSIS — R221 Localized swelling, mass and lump, neck: Secondary | ICD-10-CM | POA: Diagnosis not present

## 2019-04-12 MED ORDER — IOHEXOL 300 MG/ML  SOLN
75.0000 mL | Freq: Once | INTRAMUSCULAR | Status: AC | PRN
  Administered 2019-04-12: 14:00:00 75 mL via INTRAVENOUS

## 2019-04-12 NOTE — Telephone Encounter (Signed)
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 Tissue sampling is recommended.

## 2019-04-13 ENCOUNTER — Other Ambulatory Visit: Payer: Self-pay | Admitting: Family Medicine

## 2019-04-13 ENCOUNTER — Encounter: Payer: Self-pay | Admitting: Family Medicine

## 2019-04-13 DIAGNOSIS — K118 Other diseases of salivary glands: Secondary | ICD-10-CM

## 2019-04-13 NOTE — Telephone Encounter (Signed)
Patient called back and was informed of the results below.  Patient is aware someone will call with appt info.

## 2019-04-13 NOTE — Telephone Encounter (Signed)
I tried to call him but he didn't answer. I told him I would send you a message to get in touch with him and that I will be back in the office tomorrow if he wants to speak with me then.   The "lump" behind his ear is a growth within his parotid gland. This is something that we need to have ENT evaluate and biopsy. I have put in an urgent ENT referral for him. I would like to talk with him in person, but didn't want to delay evaluation of this.

## 2019-04-13 NOTE — Telephone Encounter (Signed)
Left a message for the pt to return my call.  

## 2019-04-15 ENCOUNTER — Encounter: Payer: Self-pay | Admitting: Gastroenterology

## 2019-04-22 ENCOUNTER — Encounter: Payer: Self-pay | Admitting: Family Medicine

## 2019-04-27 ENCOUNTER — Encounter (INDEPENDENT_AMBULATORY_CARE_PROVIDER_SITE_OTHER): Payer: Self-pay | Admitting: Otolaryngology

## 2019-04-27 ENCOUNTER — Other Ambulatory Visit: Payer: Self-pay

## 2019-04-27 ENCOUNTER — Ambulatory Visit (INDEPENDENT_AMBULATORY_CARE_PROVIDER_SITE_OTHER): Payer: PPO | Admitting: Otolaryngology

## 2019-04-27 VITALS — Temp 97.3°F

## 2019-04-27 DIAGNOSIS — K118 Other diseases of salivary glands: Secondary | ICD-10-CM | POA: Diagnosis not present

## 2019-04-27 NOTE — Progress Notes (Signed)
HPI: Russell Cox is a 68 y.o. male who presents is referred by Dr. Elease Hashimoto for evaluation of a lump behind his left ear. Patient states he first noticed lump six weeks ago just while driving down the road. It was about the size of a width of a fingernail. It gradually got bigger over the week so he called his PCP about it. He has a bad tooth on his left side so he thought it may be related. He was prescribed Clindamycin which he has finished and told to follow up with a CT scan. It has gradually gotten smaller since that time. It does not hurt. He denies a fever, mouth pain, ear pain, hearing changes, ear drainage, signs of infection.  He does smoke 1 ppd. No further complaints today.  Past Medical History:  Diagnosis Date  . Cataract    removed bilaterally  . Cigarette smoker   . COPD (chronic obstructive pulmonary disease) ([place])   . Hyperlipidemia   . Hypertension   . SINUSITIS, ACUTE MAXILLARY 02/24/2007  . URI 05/28/2007   Past Surgical History:  Procedure Laterality Date  . CATARACT EXTRACTION W/ INTRAOCULAR LENS IMPLANT Bilateral    left  . COLONOSCOPY  03/2016   repeat in 5 years  . ELBOW SURGERY  1988   tennis  . hemmorrhoidectomy  1988  . POLYPECTOMY    . SHOULDER ARTHROSCOPY W/ ACROMIAL REPAIR     left   Social History   Socioeconomic History  . Marital status: Married    Spouse name: Not on file  . Number of children: Not on file  . Years of education: Not on file  . Highest education level: Not on file  Occupational History  . Not on file  Social Needs  . Financial resource strain: Not on file  . Food insecurity    Worry: Not on file    Inability: Not on file  . Transportation needs    Medical: Not on file    Non-medical: Not on file  Tobacco Use  . Smoking status: Current Every Day Smoker    Packs/day: 1.00    Years: 54.00    Pack years: 54.00    Types: Cigarettes    Start date: 6  . Smokeless tobacco: Never Used  Substance and Sexual Activity   . Alcohol use: Yes    Comment: socially  . Drug use: No  . Sexual activity: Not on file  Lifestyle  . Physical activity    Days per week: Not on file    Minutes per session: Not on file  . Stress: Not on file  Relationships  . Social Herbalist on phone: Not on file    Gets together: Not on file    Attends religious service: Not on file    Active member of club or organization: Not on file    Attends meetings of clubs or organizations: Not on file    Relationship status: Not on file  Other Topics Concern  . Not on file  Social History Narrative  . Not on file   Family History  Problem Relation Age of Onset  . Cancer Mother        unknown what type  . Colon cancer Mother 31  . Arthritis Mother   . Heart disease Father   . Cancer Father        thyroid  . Hypertension Sister   . Hypertension Brother   . Bladder Cancer Brother 102  .  Diabetes Maternal Grandmother   . Cancer Maternal Grandfather        lung  . Colon polyps Neg Hx   . Esophageal cancer Neg Hx   . Rectal cancer Neg Hx   . Stomach cancer Neg Hx    Allergies  Allergen Reactions  . Bee Venom Anaphylaxis  . Penicillins Hives   Prior to Admission medications   Medication Sig Start Date End Date Taking? Authorizing Provider  aspirin 81 MG tablet Take 1 tablet (81 mg total) daily by mouth. 04/15/17   Marletta Lor, MD  atorvastatin (LIPITOR) 40 MG tablet Take 1 tablet (40 mg total) by mouth daily. 06/22/18   Koberlein, Steele Berg, MD  losartan-hydrochlorothiazide (HYZAAR) 100-12.5 MG tablet Take 1 tablet by mouth daily. 03/03/19   Caren Macadam, MD  sildenafil (VIAGRA) 100 MG tablet TAKE 1 TABLET BY MOUTH WHEN NEEDED FOR ERECTILE DYSFUNCTION 05/23/17   Marletta Lor, MD     Positive ROS: otherwise negative  All other systems have been reviewed and were otherwise negative with the exception of those mentioned in the HPI and as above.  Physical Exam: Constitutional: Alert,  well-appearing, no acute distress Ears: External ears without lesions or tenderness. Ear canals are clear bilaterally with intact, clear TMs. Subjectively symmetric hearing bilaterally. Nasal: External nose without lesions. Septum with minimal deformity. Clear nasal passages Oral: Lips and gums without lesions. Tongue and palate mucosa without lesions. Poor dentition but no distinct infection. Salivary duct openings clear with clear saliva secretions. Posterior oropharynx clear with no erythema, exudate. Neck: He has <1cm fullness posterior to his left ear lobe. No distinct mass appreciated. Non-tender. Otherwise no lymphadenopathy. Respiratory: Breathing comfortably  Skin: No facial/neck lesions or rash noted.   Assessment: Parotid mass, given history, examination and review of images, this likely represents an inflammatory process.  Plan: CT images and report reviewed personally in office and with patient. Showed <2 cm left parotid mass. Given mass has gotten considerably smaller since patient first noticed and with abx, likely represents an inflammatory process. Recommend observation at this time. Discussed with patient that if lump returns, he needs to return to office for re-assessment prior to any treatment. If mass recurs, we will consider US guided biopsy. Patient agrees with plan. He will return as needed.  Christin Hoffstadt, PA-C   I have personally seen and examined this patient. I agree with the assessment and plan as outlined above. Radene Journey, MD

## 2019-04-29 ENCOUNTER — Encounter: Payer: Self-pay | Admitting: Family Medicine

## 2019-05-04 MED FILL — LOSARTAN-HCTZ 100-12.5 MG T: 100-12.5 | 30 days supply | Qty: 30 | Fill #2

## 2019-06-02 DIAGNOSIS — I10 Essential (primary) hypertension: Secondary | ICD-10-CM | POA: Diagnosis not present

## 2019-06-02 DIAGNOSIS — Z20828 Contact with and (suspected) exposure to other viral communicable diseases: Secondary | ICD-10-CM | POA: Diagnosis not present

## 2019-06-07 MED FILL — LOSARTAN-HCTZ 100-12.5 MG T: 100-12.5 | 30 days supply | Qty: 30 | Fill #3

## 2019-07-05 ENCOUNTER — Encounter: Payer: Self-pay | Admitting: Family Medicine

## 2019-07-05 ENCOUNTER — Other Ambulatory Visit: Payer: Self-pay | Admitting: Family Medicine

## 2019-07-05 MED FILL — LOSARTAN-HCTZ 100-12.5 MG T: 100-12.5 | 30 days supply | Qty: 30 | Fill #4

## 2019-07-06 MED ORDER — ATORVASTATIN CALCIUM 40 MG PO TABS: 40.0000 mg | ORAL_TABLET | Freq: Every day | ORAL | 0 refills | Status: DC

## 2019-07-06 MED FILL — ATORVASTATIN 40 MG TABLET: 40 | 90 days supply | Qty: 90 | Fill #0

## 2019-07-23 ENCOUNTER — Ambulatory Visit: Payer: PPO | Attending: Internal Medicine

## 2019-07-23 DIAGNOSIS — Z23 Encounter for immunization: Secondary | ICD-10-CM | POA: Insufficient documentation

## 2019-07-23 NOTE — Progress Notes (Signed)
   Covid-19 Vaccination Clinic  Name:  Russell Cox    MRN: [id number] DOB: [birth year]  07/23/2019  [name] was observed post Covid-19 immunization for 15 minutes without incidence. He was provided with Vaccine Information Sheet and instruction to access the V-Safe system.   [name] was instructed to call 911 with any severe reactions post vaccine: Marland Kitchen Difficulty breathing  . Swelling of your face and throat  . A fast heartbeat  . A bad rash all over your body  . Dizziness and weakness    Immunizations Administered    Name Date Dose VIS Date Route   Pfizer COVID-19 Vaccine 07/23/2019 12:06 PM 0.3 mL 05/21/2019 Intramuscular   Manufacturer: [place]   Lot: [id number]   [place]: [id number]

## 2019-08-05 MED FILL — LOSARTAN-HCTZ 100-12.5 MG T: 100-12.5 | 30 days supply | Qty: 30 | Fill #5

## 2019-08-14 ENCOUNTER — Ambulatory Visit: Payer: PPO | Attending: Internal Medicine

## 2019-08-14 DIAGNOSIS — Z23 Encounter for immunization: Secondary | ICD-10-CM | POA: Insufficient documentation

## 2019-08-14 NOTE — Progress Notes (Signed)
   Covid-19 Vaccination Clinic  Name:  Russell Cox    MRN: [id number] DOB: 02/11/1951  08/14/2019  [name] was observed post Covid-19 immunization for 15 minutes without incident. He was provided with Vaccine Information Sheet and instruction to access the V-Safe system.   [name] was instructed to call 911 with any severe reactions post vaccine: Marland Kitchen Difficulty breathing  . Swelling of face and throat  . A fast heartbeat  . A bad rash all over body  . Dizziness and weakness   Immunizations Administered    Name Date Dose VIS Date Route   Pfizer COVID-19 Vaccine 08/14/2019  3:35 PM 0.3 mL 05/21/2019 Intramuscular   Manufacturer: [place]   Lot: [id number]   [place]: [id number]

## 2019-09-06 MED FILL — LOSARTAN-HCTZ 100-12.5 MG T: 100-12.5 | 30 days supply | Qty: 30 | Fill #6

## 2019-10-04 ENCOUNTER — Other Ambulatory Visit: Payer: Self-pay | Admitting: Family Medicine

## 2019-10-04 MED FILL — LOSARTAN-HCTZ 100-12.5 MG T: 100-12.5 | 90 days supply | Qty: 90 | Fill #7

## 2019-10-05 MED FILL — ATORVASTATIN 40 MG TABLET: 40 | 90 days supply | Qty: 90 | Fill #0

## 2019-12-08 ENCOUNTER — Telehealth: Payer: Self-pay | Admitting: *Deleted

## 2019-12-08 ENCOUNTER — Other Ambulatory Visit: Payer: Self-pay

## 2019-12-08 ENCOUNTER — Encounter: Payer: Self-pay | Admitting: Family Medicine

## 2019-12-08 ENCOUNTER — Other Ambulatory Visit (HOSPITAL_COMMUNITY)
Admission: RE | Admit: 2019-12-08 | Discharge: 2019-12-08 | Disposition: A | Discharge status: Home / Self Care | Payer: PPO | Source: Ambulatory Visit | Attending: Family Medicine | Admitting: Family Medicine

## 2019-12-08 ENCOUNTER — Other Ambulatory Visit: Payer: Self-pay | Admitting: Family Medicine

## 2019-12-08 ENCOUNTER — Ambulatory Visit (INDEPENDENT_AMBULATORY_CARE_PROVIDER_SITE_OTHER): Payer: PPO | Admitting: Family Medicine

## 2019-12-08 VITALS — BP 120/58 | HR 105 | Temp 96.5°F | Ht 72.0 in | Wt 162.3 lb

## 2019-12-08 DIAGNOSIS — Z202 Contact with and (suspected) exposure to infections with a predominantly sexual mode of transmission: Secondary | ICD-10-CM

## 2019-12-08 DIAGNOSIS — E785 Hyperlipidemia, unspecified: Secondary | ICD-10-CM | POA: Diagnosis not present

## 2019-12-08 DIAGNOSIS — R634 Abnormal weight loss: Secondary | ICD-10-CM | POA: Diagnosis not present

## 2019-12-08 DIAGNOSIS — R05 Cough: Secondary | ICD-10-CM

## 2019-12-08 DIAGNOSIS — R059 Cough, unspecified: Secondary | ICD-10-CM

## 2019-12-08 DIAGNOSIS — I10 Essential (primary) hypertension: Secondary | ICD-10-CM | POA: Diagnosis not present

## 2019-12-08 DIAGNOSIS — Z125 Encounter for screening for malignant neoplasm of prostate: Secondary | ICD-10-CM | POA: Diagnosis not present

## 2019-12-08 DIAGNOSIS — Z720 Tobacco use: Secondary | ICD-10-CM

## 2019-12-08 MED ORDER — METRONIDAZOLE 500 MG PO TABS: 2000.0000 mg | ORAL_TABLET | Freq: Once | ORAL | 0 refills | Status: DC

## 2019-12-08 MED FILL — METRONIDAZOLE 500 MG TABS: 500 | 1 days supply | Qty: 4 | Fill #0

## 2019-12-08 NOTE — Progress Notes (Signed)
Russell Cox DOB: 1950/08/13 Encounter date: 12/08/2019  This is a 69 y.o. male who presents with Chief Complaint  Patient presents with  . Exposure to STD    History of present illness: Has been losing weight - more in last 3 weeks. Appetite is ok.  No pain.  Feels like he is eating the same.  He has been keeping busy, but activity level is about the same.  He does have some morning cough, which she has had for a long time (as he is a smoker).  He does feel like there is perhaps some more congestion recently.  Feels that breathing is stable overall.  He is up-to-date with colonoscopy.  He had CT of the chest done in the last year for lung cancer screening.  He is here today for follow-up since his wife was found to have trichomonas on a Pap smear.  Neither of them have been unfaithful, either of them had had symptoms until wife most recently was in the hospital and had some vaginal bleeding when put on blood thinner.  He would like to be tested to try and get to the bottom of the situation.   Allergies  Allergen Reactions  . Bee Venom Anaphylaxis  . Penicillins Hives   Current Meds  Medication Sig  . aspirin 81 MG tablet Take 1 tablet (81 mg total) daily by mouth.  Marland Kitchen atorvastatin (LIPITOR) 40 MG tablet TAKE 1 TABLET BY MOUTH DAILY. **PATIENT NEEDS AN APPOINTMENT  . losartan-hydrochlorothiazide (HYZAAR) 100-12.5 MG tablet Take 1 tablet by mouth daily.  . sildenafil (VIAGRA) 100 MG tablet TAKE 1 TABLET BY MOUTH WHEN NEEDED FOR ERECTILE DYSFUNCTION  . [DISCONTINUED] metroNIDAZOLE (FLAGYL) 500 MG tablet Take 4 tablets (2,000 mg total) by mouth once for 1 dose.    Review of Systems  Constitutional: Positive for unexpected weight change. Negative for chills, fatigue and fever.  Respiratory: Negative for cough, chest tightness, shortness of breath and wheezing.   Cardiovascular: Negative for chest pain, palpitations and leg swelling.    Objective:  BP (!) 120/58 (BP Location: Left Arm,  Patient Position: Sitting, Cuff Size: Normal)   Pulse (!) 105   Temp (!) 96.5 F (35.8 C) (Temporal)   Ht 6' (1.829 m)   Wt 162 lb 4.8 oz (73.6 kg)   BMI 22.01 kg/m   Weight: 162 lb 4.8 oz (73.6 kg)   BP Readings from Last 3 Encounters:  12/08/19 (!) 120/58  04/07/19 140/76  04/05/19 129/82   Wt Readings from Last 3 Encounters:  12/08/19 162 lb 4.8 oz (73.6 kg)  04/07/19 171 lb 14.4 oz (78 kg)  04/05/19 170 lb 12.8 oz (77.5 kg)    Physical Exam Constitutional:      General: He is not in acute distress.    Appearance: He is well-developed.  Cardiovascular:     Rate and Rhythm: Normal rate and regular rhythm.     Heart sounds: Normal heart sounds. No murmur heard.  No friction rub.  Pulmonary:     Effort: Pulmonary effort is normal. No respiratory distress.     Breath sounds: Normal breath sounds. No wheezing or rales.  Musculoskeletal:     Right lower leg: No edema.     Left lower leg: No edema.  Neurological:     Mental Status: He is alert and oriented to person, place, and time.  Psychiatric:        Behavior: Behavior normal.     Assessment/Plan  1. STD exposure  We discussed different methods for screening, patient prefers urine testing over direct testing. - Urine cytology ancillary only  2. Essential hypertension Blood pressure on the lower end of normal.  Encouraged him to monitor at home.  Suspect lower secondary to weight loss.  Continue current medication.- CBC with Differential/Platelet; Future - Comprehensive metabolic panel; Future - Comprehensive metabolic panel - CBC with Differential/Platelet  3. Dyslipidemia Continue Lipitor 40 mg daily. - Lipid panel; Future - Lipid panel  4. Tobacco abuse Patient states he is considering stopping.  He we have discussed options for help with quitting in the past, and he states he will call me and let me know if he is ready for this.  5. Weight loss Uncertain reason for weight loss.  When looking back,  weight is stable from where it was over 10 years ago, but I am still concerned with his unintentional weight loss.  We will start with some blood work, and consider further evaluation pending this. - TSH; Future - TSH  6. Prostate cancer screening - PSA; Future - PSA   Return for pending labs.    [name], MD

## 2019-12-08 NOTE — Telephone Encounter (Signed)
-----   Message from Caren Macadam, MD sent at 12/08/2019 12:25 PM EDT ----- Patient would like to also complete STD testing since wife has been treated. I believe he is asymptomatic and just needs to have the culture obtained, but we will need an office visit to do transurethral swab since this is more accurate. If he wants to be squeezed in for 2:45 today that would work or you can find him another time at his convenience.

## 2019-12-08 NOTE — Telephone Encounter (Signed)
Spoke with the pt and advised him to arrive today at 2:30pm for a work in.  Message sent to Brittney to add the appt.

## 2019-12-09 LAB — CBC WITH DIFFERENTIAL/PLATELET
Basophils Absolute: 0.1 10*3/uL (ref 0.0–0.1)
Basophils Relative: 1 % (ref 0.0–3.0)
Eosinophils Absolute: 0.2 10*3/uL (ref 0.0–0.7)
Eosinophils Relative: 3.2 % (ref 0.0–5.0)
HCT: 41 % (ref 39.0–52.0)
Hemoglobin: 14 g/dL (ref 13.0–17.0)
Lymphocytes Relative: 22.4 % (ref 12.0–46.0)
Lymphs Abs: 1.4 10*3/uL (ref 0.7–4.0)
MCHC: 34.2 g/dL (ref 30.0–36.0)
MCV: 97.8 fl (ref 78.0–100.0)
Monocytes Absolute: 0.7 10*3/uL (ref 0.1–1.0)
Monocytes Relative: 11.3 % (ref 3.0–12.0)
Neutro Abs: 3.8 10*3/uL (ref 1.4–7.7)
Neutrophils Relative %: 62.1 % (ref 43.0–77.0)
Platelets: 194 10*3/uL (ref 150.0–400.0)
RBC: 4.19 Mil/uL — ABNORMAL LOW (ref 4.22–5.81)
RDW: 13.2 % (ref 11.5–15.5)
WBC: 6.1 10*3/uL (ref 4.0–10.5)

## 2019-12-09 LAB — COMPREHENSIVE METABOLIC PANEL
ALT: 19 U/L (ref 0–53)
AST: 22 U/L (ref 0–37)
Albumin: 4.4 g/dL (ref 3.5–5.2)
Alkaline Phosphatase: 60 U/L (ref 39–117)
BUN: 21 mg/dL (ref 6–23)
CO2: 26 mEq/L (ref 19–32)
Calcium: 9.3 mg/dL (ref 8.4–10.5)
Chloride: 101 mEq/L (ref 96–112)
Creatinine, Ser: 1.52 mg/dL — ABNORMAL HIGH (ref 0.40–1.50)
GFR: 45.5 mL/min — ABNORMAL LOW (ref >60.00)
Glucose, Bld: 95 mg/dL (ref 70–99)
Potassium: 4.1 mEq/L (ref 3.5–5.1)
Sodium: 136 mEq/L (ref 135–145)
Total Bilirubin: 0.6 mg/dL (ref 0.2–1.2)
Total Protein: 6.7 g/dL (ref 6.0–8.3)

## 2019-12-09 LAB — PSA: PSA: 0.13 ng/mL (ref 0.10–4.00)

## 2019-12-09 LAB — LIPID PANEL
Cholesterol: 161 mg/dL (ref 0–200)
HDL: 72.3 mg/dL (ref >39.00)
LDL Cholesterol: 59 mg/dL (ref 0–99)
NonHDL: 88.77
Total CHOL/HDL Ratio: 2
Triglycerides: 148 mg/dL (ref 0.0–149.0)
VLDL: 29.6 mg/dL (ref 0.0–40.0)

## 2019-12-09 LAB — TSH: TSH: 1.71 u[IU]/mL (ref 0.35–4.50)

## 2019-12-10 LAB — URINE CYTOLOGY ANCILLARY ONLY
Chlamydia: NEGATIVE
Comment: NEGATIVE
Comment: NEGATIVE
Comment: NORMAL
Neisseria Gonorrhea: NEGATIVE
Trichomonas: NEGATIVE

## 2019-12-14 NOTE — Addendum Note (Signed)
Addended by: Agnes Lawrence on: 12/14/2019 10:38 AM   Modules accepted: Orders

## 2019-12-15 ENCOUNTER — Other Ambulatory Visit: Payer: Self-pay | Admitting: Family Medicine

## 2019-12-15 DIAGNOSIS — R059 Cough, unspecified: Secondary | ICD-10-CM

## 2019-12-15 NOTE — Progress Notes (Signed)
X-ray ordered.

## 2020-01-07 DIAGNOSIS — W548XXA Other contact with dog, initial encounter: Secondary | ICD-10-CM | POA: Diagnosis not present

## 2020-01-07 DIAGNOSIS — S40862A Insect bite (nonvenomous) of left upper arm, initial encounter: Secondary | ICD-10-CM | POA: Diagnosis not present

## 2020-01-07 DIAGNOSIS — I10 Essential (primary) hypertension: Secondary | ICD-10-CM | POA: Diagnosis not present

## 2020-01-07 DIAGNOSIS — L03114 Cellulitis of left upper limb: Secondary | ICD-10-CM | POA: Diagnosis not present

## 2020-01-09 IMAGING — CT CT NECK W/ CM
4 series · 15 of 33 positions shown, 18 images · IV contrast (OMNIPAQUE 300)
Comparison: None.

CLINICAL DATA: Left neck mass

EXAM:
CT NECK WITH CONTRAST
TECHNIQUE: Multidetector CT imaging of the neck was performed using the
standard protocol following the bolus administration of intravenous
contrast.
CONTRAST:  75mL OMNIPAQUE IOHEXOL 300 MG/ML  SOLN

[Series 3: neck 2.0 i31s 2 · axial · 0.54mm/px · z∈[-244,-68]mm · 5 of 134 slices shown, 7 images]
[im 23/134  soft-tissue]
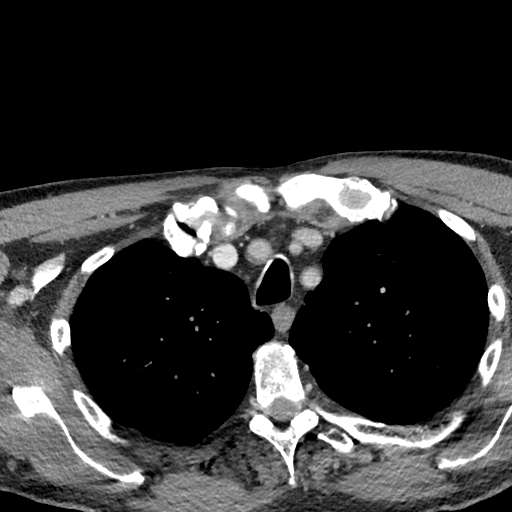
[im 23/134  bone]
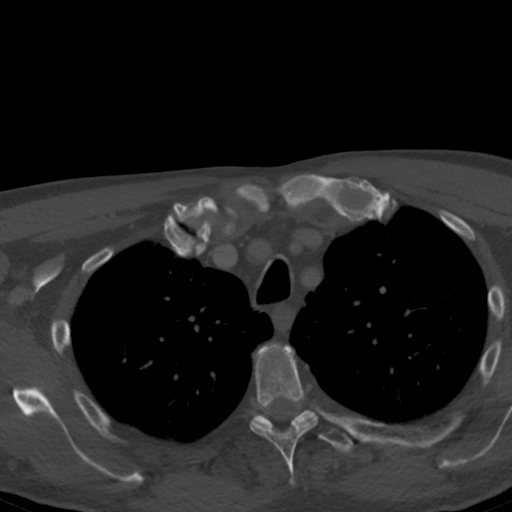
[im 45/134  bone]
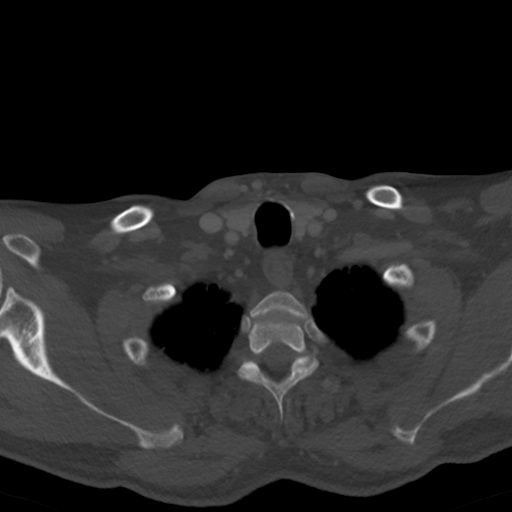
[im 67/134  bone]
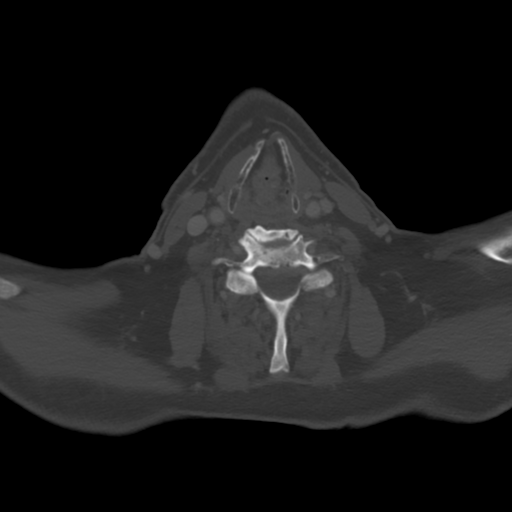
[im 89/134  bone]
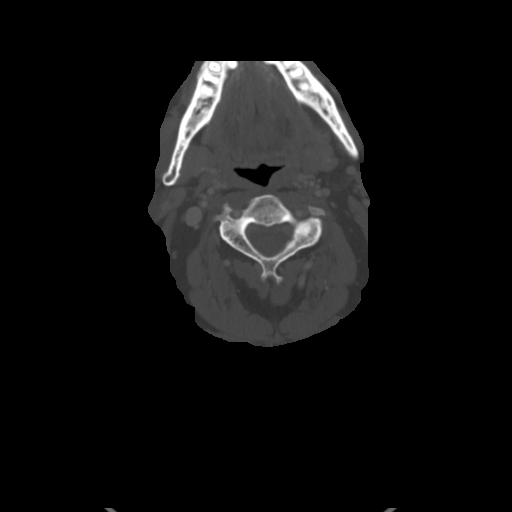
[im 111/134  soft-tissue]
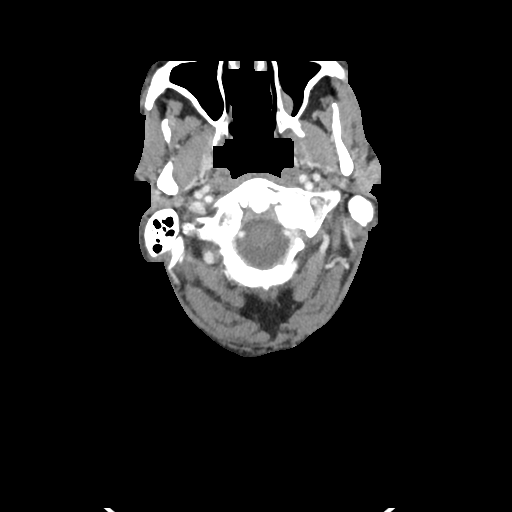
[im 111/134  bone]
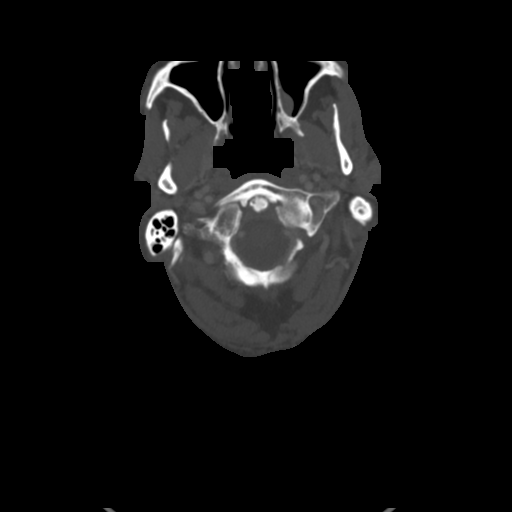

[Series 6: coronal st · coronal · 0.52mm/px · 3 of 129 slices shown]
[im 26/129  bone]
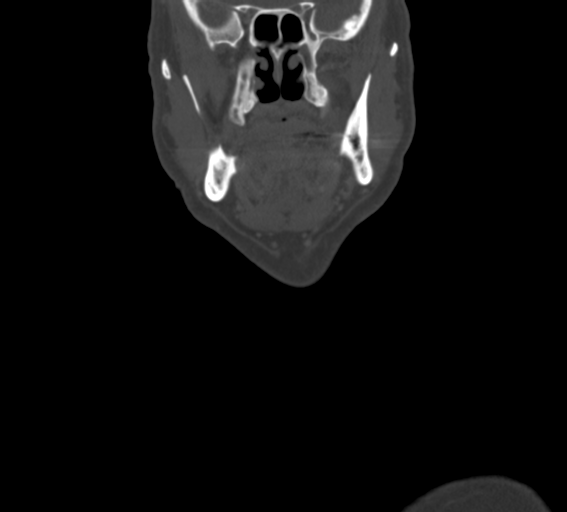
[im 52/129  bone]
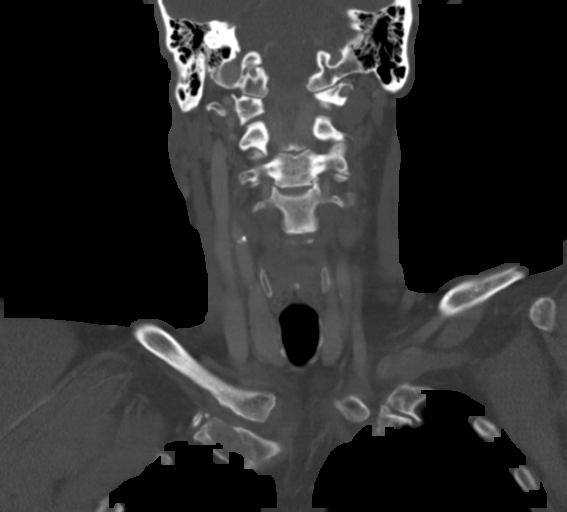
[im 77/129  bone]
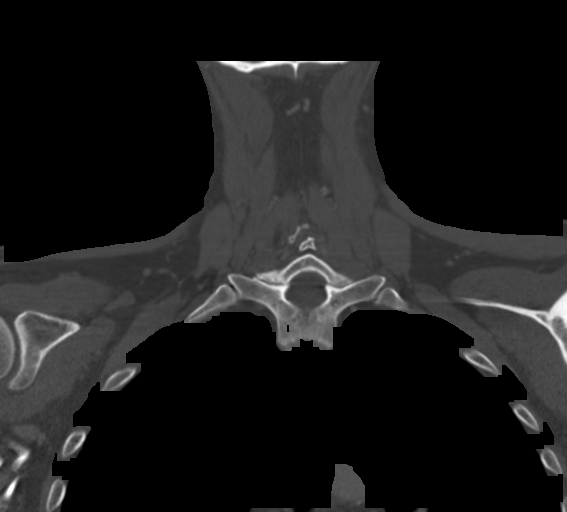

[Series 7: sagittal st · sagittal · 0.52mm/px · 5 of 141 slices shown, 6 images]
[im 47/141  bone]
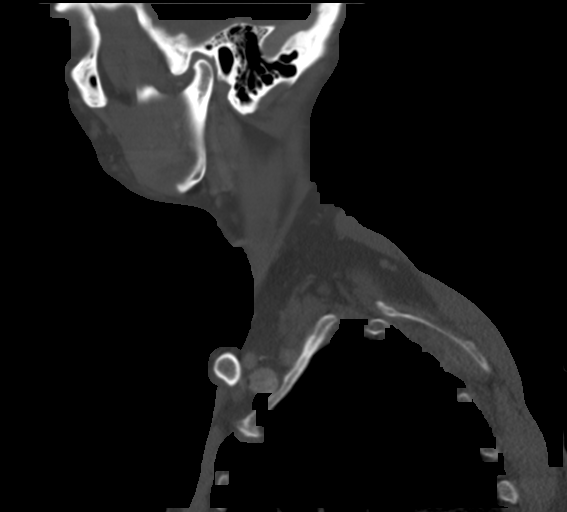
[im 59/141  bone]
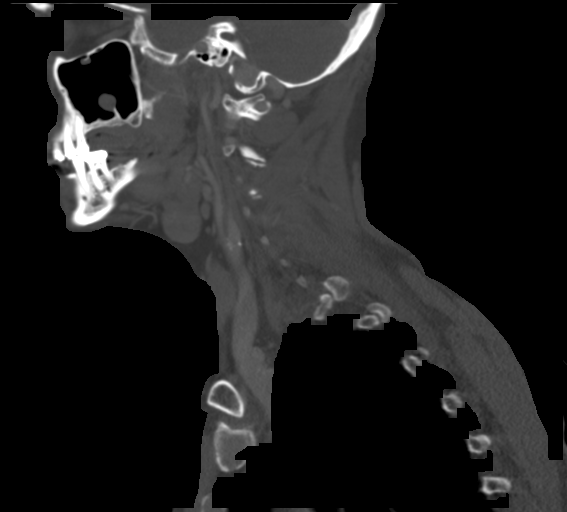
[im 71/141  soft-tissue]
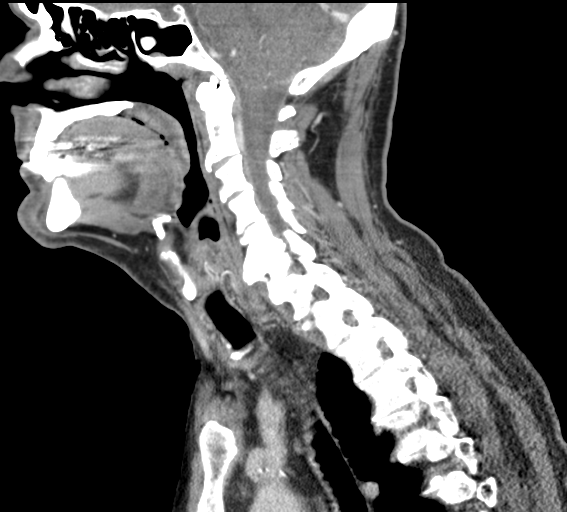
[im 71/141  bone]
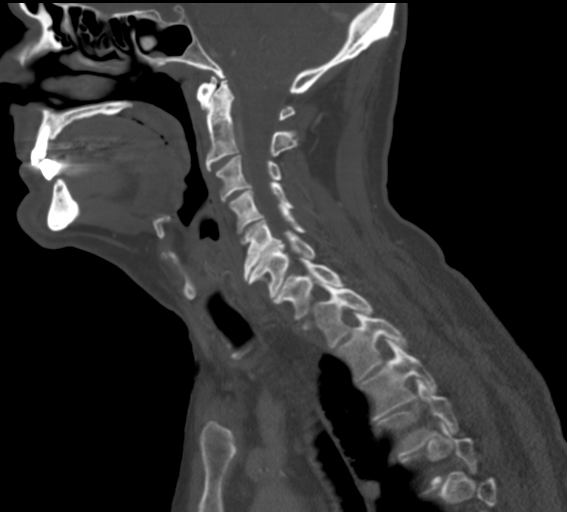
[im 82/141  bone]
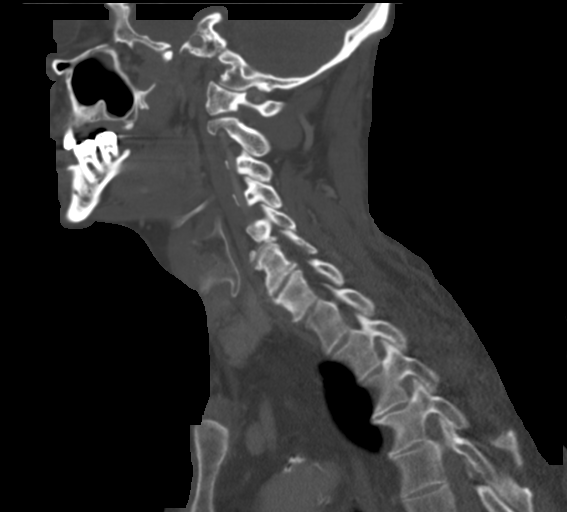
[im 94/141  bone]
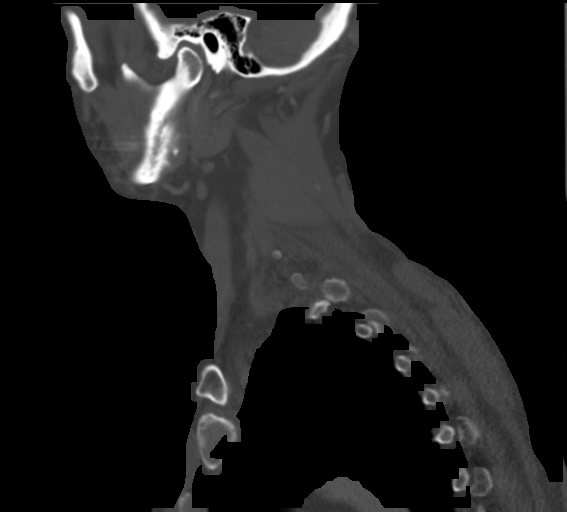

[Series 8: orthogonal · axial · 0.39mm/px · z∈[-278,-232]mm · 2 of 144 slices shown]
[im 24/144  bone]
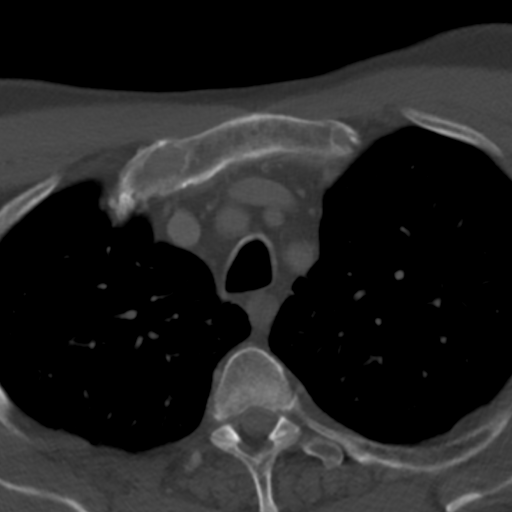
[im 48/144  bone]
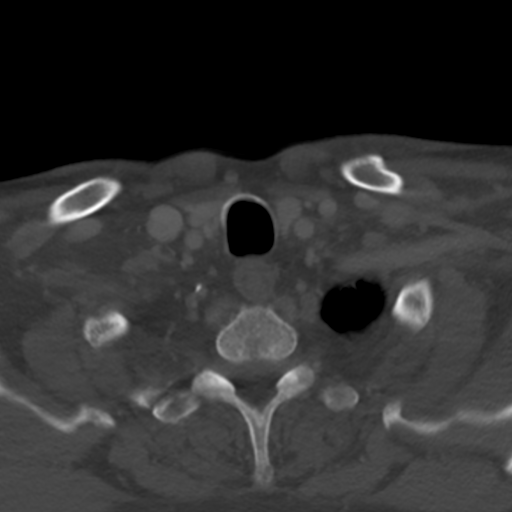

[15 of 33 positions shown; findings below may reference images not displayed]

FINDINGS: Pharynx and larynx: Unremarkable.

Salivary glands: Underlying the skin marker, there is a
heterogeneously enhancing lesion with areas of low attenuation
within the superficial left parotid. This measures approximately
x 1.5 x 1.9 cm.

Thyroid: Unremarkable.

Lymph nodes: No enlarged on chronic lymph nodes.

Vascular: Major neck vessels are patent.

Limited intracranial: No abnormal enhancement

Visualized orbits: Bilateral lens replacements

Mastoids and visualized paranasal sinuses: Mild minor paranasal
sinus mucosal thickening. Mastoid air cells are clear.

Skeleton: Multilevel degenerative changes of the cervical spine.

Upper chest: There is no apical lung mass.

Other: Dental disease is present including periapical lucency about
the left maxillary canine and premolars.
IMPRESSION: Left parotid mass, which could reflect a primary parotid neoplasm.
Tissue sampling is recommended.

These results will be called to the ordering clinician or
representative by the Radiologist Assistant, and communication
documented in the PACS or zVision Dashboard.

## 2020-01-10 ENCOUNTER — Other Ambulatory Visit: Payer: Self-pay | Admitting: Family Medicine

## 2020-01-10 MED FILL — LOSARTAN-HCTZ 100-12.5 MG T: 100-12.5 | 60 days supply | Qty: 60 | Fill #8

## 2020-01-10 MED FILL — ATORVASTATIN CALCIUM 40 MG: 40 | 90 days supply | Qty: 90 | Fill #0

## 2020-01-13 ENCOUNTER — Other Ambulatory Visit: Payer: Self-pay

## 2020-01-13 DIAGNOSIS — I10 Essential (primary) hypertension: Secondary | ICD-10-CM

## 2020-01-13 NOTE — Progress Notes (Signed)
Per last lab results ordered CMP per JK for future order/thx dmf

## 2020-01-31 MED FILL — EPINEPHRINE 0.3 MG AUTO-INJ: 0.3 | 30 days supply | Qty: 2 | Fill #0

## 2020-02-16 ENCOUNTER — Other Ambulatory Visit: Payer: PPO

## 2020-02-21 ENCOUNTER — Other Ambulatory Visit: Payer: Self-pay

## 2020-02-21 ENCOUNTER — Encounter: Payer: Self-pay | Admitting: Dermatology

## 2020-02-21 ENCOUNTER — Ambulatory Visit: Payer: PPO | Admitting: Dermatology

## 2020-02-21 DIAGNOSIS — Z1283 Encounter for screening for malignant neoplasm of skin: Secondary | ICD-10-CM

## 2020-02-21 DIAGNOSIS — L570 Actinic keratosis: Secondary | ICD-10-CM

## 2020-02-21 NOTE — Progress Notes (Signed)
LN2 on face (left root of nose) Dr Denna Haggard

## 2020-02-28 ENCOUNTER — Other Ambulatory Visit: Payer: PPO

## 2020-03-06 ENCOUNTER — Other Ambulatory Visit: Payer: Self-pay

## 2020-03-06 ENCOUNTER — Other Ambulatory Visit: Payer: PPO

## 2020-03-06 DIAGNOSIS — I10 Essential (primary) hypertension: Secondary | ICD-10-CM | POA: Diagnosis not present

## 2020-03-07 LAB — COMPREHENSIVE METABOLIC PANEL
AG Ratio: 1.8 (calc) (ref 1.0–2.5)
ALT: 17 U/L (ref 9–46)
AST: 21 U/L (ref 10–35)
Albumin: 4.5 g/dL (ref 3.6–5.1)
Alkaline phosphatase (APISO): 62 U/L (ref 35–144)
BUN/Creatinine Ratio: 14 (calc) (ref 6–22)
BUN: 18 mg/dL (ref 7–25)
CO2: 26 mmol/L (ref 20–32)
Calcium: 9.5 mg/dL (ref 8.6–10.3)
Chloride: 99 mmol/L (ref 98–110)
Creat: 1.25 mg/dL — ABNORMAL HIGH (ref 0.70–1.18)
Globulin: 2.5 g/dL (calc) (ref 1.9–3.7)
Glucose, Bld: 111 mg/dL — ABNORMAL HIGH (ref 65–99)
Potassium: 4.1 mmol/L (ref 3.5–5.3)
Sodium: 134 mmol/L — ABNORMAL LOW (ref 135–146)
Total Bilirubin: 0.4 mg/dL (ref 0.2–1.2)
Total Protein: 7 g/dL (ref 6.1–8.1)

## 2020-03-08 DIAGNOSIS — M9903 Segmental and somatic dysfunction of lumbar region: Secondary | ICD-10-CM | POA: Diagnosis not present

## 2020-03-08 DIAGNOSIS — M9902 Segmental and somatic dysfunction of thoracic region: Secondary | ICD-10-CM | POA: Diagnosis not present

## 2020-03-08 DIAGNOSIS — M5137 Other intervertebral disc degeneration, lumbosacral region: Secondary | ICD-10-CM | POA: Diagnosis not present

## 2020-03-08 DIAGNOSIS — M5386 Other specified dorsopathies, lumbar region: Secondary | ICD-10-CM | POA: Diagnosis not present

## 2020-03-08 DIAGNOSIS — S29012A Strain of muscle and tendon of back wall of thorax, initial encounter: Secondary | ICD-10-CM | POA: Diagnosis not present

## 2020-03-08 DIAGNOSIS — M9904 Segmental and somatic dysfunction of sacral region: Secondary | ICD-10-CM | POA: Diagnosis not present

## 2020-03-10 ENCOUNTER — Other Ambulatory Visit (HOSPITAL_COMMUNITY): Payer: Self-pay | Admitting: Family Medicine

## 2020-03-10 ENCOUNTER — Other Ambulatory Visit: Payer: Self-pay | Admitting: Family Medicine

## 2020-03-10 DIAGNOSIS — I10 Essential (primary) hypertension: Secondary | ICD-10-CM

## 2020-03-10 MED FILL — LOSARTAN-HCTZ 100-12.5 MG T: 100-12.5 | 60 days supply | Qty: 60 | Fill #0

## 2020-03-13 DIAGNOSIS — M5032 Other cervical disc degeneration, mid-cervical region, unspecified level: Secondary | ICD-10-CM | POA: Diagnosis not present

## 2020-03-13 DIAGNOSIS — M5134 Other intervertebral disc degeneration, thoracic region: Secondary | ICD-10-CM | POA: Diagnosis not present

## 2020-03-13 DIAGNOSIS — M5136 Other intervertebral disc degeneration, lumbar region: Secondary | ICD-10-CM | POA: Diagnosis not present

## 2020-03-13 DIAGNOSIS — M9901 Segmental and somatic dysfunction of cervical region: Secondary | ICD-10-CM | POA: Diagnosis not present

## 2020-03-13 DIAGNOSIS — M9903 Segmental and somatic dysfunction of lumbar region: Secondary | ICD-10-CM | POA: Diagnosis not present

## 2020-03-13 DIAGNOSIS — M9902 Segmental and somatic dysfunction of thoracic region: Secondary | ICD-10-CM | POA: Diagnosis not present

## 2020-03-13 MED FILL — FLUAD QUADRIVALENT 0.5 ML P: 0.5 | 1 days supply | Qty: 1 | Fill #0

## 2020-03-15 ENCOUNTER — Other Ambulatory Visit: Payer: Self-pay

## 2020-03-15 ENCOUNTER — Ambulatory Visit (INDEPENDENT_AMBULATORY_CARE_PROVIDER_SITE_OTHER)
Admission: RE | Admit: 2020-03-15 | Discharge: 2020-03-15 | Disposition: A | Discharge status: Home / Self Care | Payer: PPO | Source: Ambulatory Visit | Attending: Acute Care | Admitting: Acute Care

## 2020-03-15 DIAGNOSIS — M5032 Other cervical disc degeneration, mid-cervical region, unspecified level: Secondary | ICD-10-CM | POA: Diagnosis not present

## 2020-03-15 DIAGNOSIS — M9902 Segmental and somatic dysfunction of thoracic region: Secondary | ICD-10-CM | POA: Diagnosis not present

## 2020-03-15 DIAGNOSIS — M9903 Segmental and somatic dysfunction of lumbar region: Secondary | ICD-10-CM | POA: Diagnosis not present

## 2020-03-15 DIAGNOSIS — Z122 Encounter for screening for malignant neoplasm of respiratory organs: Secondary | ICD-10-CM | POA: Diagnosis not present

## 2020-03-15 DIAGNOSIS — Z87891 Personal history of nicotine dependence: Secondary | ICD-10-CM

## 2020-03-15 DIAGNOSIS — F17210 Nicotine dependence, cigarettes, uncomplicated: Secondary | ICD-10-CM

## 2020-03-15 DIAGNOSIS — M9901 Segmental and somatic dysfunction of cervical region: Secondary | ICD-10-CM | POA: Diagnosis not present

## 2020-03-15 DIAGNOSIS — M5134 Other intervertebral disc degeneration, thoracic region: Secondary | ICD-10-CM | POA: Diagnosis not present

## 2020-03-15 DIAGNOSIS — M5136 Other intervertebral disc degeneration, lumbar region: Secondary | ICD-10-CM | POA: Diagnosis not present

## 2020-03-17 ENCOUNTER — Encounter: Payer: Self-pay | Admitting: Dermatology

## 2020-03-17 DIAGNOSIS — M5134 Other intervertebral disc degeneration, thoracic region: Secondary | ICD-10-CM | POA: Diagnosis not present

## 2020-03-17 DIAGNOSIS — M5032 Other cervical disc degeneration, mid-cervical region, unspecified level: Secondary | ICD-10-CM | POA: Diagnosis not present

## 2020-03-17 DIAGNOSIS — M9903 Segmental and somatic dysfunction of lumbar region: Secondary | ICD-10-CM | POA: Diagnosis not present

## 2020-03-17 DIAGNOSIS — M9902 Segmental and somatic dysfunction of thoracic region: Secondary | ICD-10-CM | POA: Diagnosis not present

## 2020-03-17 DIAGNOSIS — M9901 Segmental and somatic dysfunction of cervical region: Secondary | ICD-10-CM | POA: Diagnosis not present

## 2020-03-17 DIAGNOSIS — M5136 Other intervertebral disc degeneration, lumbar region: Secondary | ICD-10-CM | POA: Diagnosis not present

## 2020-03-17 NOTE — Progress Notes (Signed)
   Follow-Up Visit   Subjective  Russell Cox is a 69 y.o. male who presents for the following: Annual Exam (skin check--concern about 1 spot near forehead).  General skin check Location:  Duration:  Quality:  Associated Signs/Symptoms: Modifying Factors:  Severity:  Timing: Context:   Objective  Well appearing patient in no apparent distress; mood and affect are within normal limits.  A full examination was performed including scalp, head, eyes, ears, nose, lips, neck, chest, axillae, abdomen, back, buttocks, bilateral upper extremities, bilateral lower extremities, hands, feet, fingers, toes, fingernails, and toenails. All findings within normal limits unless otherwise noted below.   Assessment & Plan    AK (actinic keratosis) (2) Left Root of Nose; Right Nasal Sidewall  Destruction of lesion - Left Root of Nose, Right Nasal Sidewall Complexity: simple   Destruction method: cryotherapy   Informed consent: discussed and consent obtained   Timeout:  patient name, date of birth, surgical site, and procedure verified Lesion destroyed using liquid nitrogen: Yes   Region frozen until ice ball extended beyond lesion: Yes   Cryotherapy cycles:  5 Outcome: patient tolerated procedure well with no complications    Encounter for screening for malignant neoplasm of skin Mid Back  Self examined his skin twice annually, dermatologist yearly.     I, Lavonna Monarch, MD, have reviewed all documentation for this visit.  The documentation on 03/17/20 for the exam, diagnosis, procedures, and orders are all accurate and complete.

## 2020-03-20 DIAGNOSIS — M5032 Other cervical disc degeneration, mid-cervical region, unspecified level: Secondary | ICD-10-CM | POA: Diagnosis not present

## 2020-03-20 DIAGNOSIS — M5136 Other intervertebral disc degeneration, lumbar region: Secondary | ICD-10-CM | POA: Diagnosis not present

## 2020-03-20 DIAGNOSIS — M9901 Segmental and somatic dysfunction of cervical region: Secondary | ICD-10-CM | POA: Diagnosis not present

## 2020-03-20 DIAGNOSIS — M9902 Segmental and somatic dysfunction of thoracic region: Secondary | ICD-10-CM | POA: Diagnosis not present

## 2020-03-20 DIAGNOSIS — M5134 Other intervertebral disc degeneration, thoracic region: Secondary | ICD-10-CM | POA: Diagnosis not present

## 2020-03-20 DIAGNOSIS — M9903 Segmental and somatic dysfunction of lumbar region: Secondary | ICD-10-CM | POA: Diagnosis not present

## 2020-03-21 NOTE — Progress Notes (Signed)

## 2020-03-22 ENCOUNTER — Telehealth: Payer: Self-pay | Admitting: Acute Care

## 2020-03-22 DIAGNOSIS — M5032 Other cervical disc degeneration, mid-cervical region, unspecified level: Secondary | ICD-10-CM | POA: Diagnosis not present

## 2020-03-22 DIAGNOSIS — M9901 Segmental and somatic dysfunction of cervical region: Secondary | ICD-10-CM | POA: Diagnosis not present

## 2020-03-22 DIAGNOSIS — Z87891 Personal history of nicotine dependence: Secondary | ICD-10-CM

## 2020-03-22 DIAGNOSIS — M5136 Other intervertebral disc degeneration, lumbar region: Secondary | ICD-10-CM | POA: Diagnosis not present

## 2020-03-22 DIAGNOSIS — M9902 Segmental and somatic dysfunction of thoracic region: Secondary | ICD-10-CM | POA: Diagnosis not present

## 2020-03-22 DIAGNOSIS — M9903 Segmental and somatic dysfunction of lumbar region: Secondary | ICD-10-CM | POA: Diagnosis not present

## 2020-03-22 DIAGNOSIS — F17210 Nicotine dependence, cigarettes, uncomplicated: Secondary | ICD-10-CM

## 2020-03-22 DIAGNOSIS — M5134 Other intervertebral disc degeneration, thoracic region: Secondary | ICD-10-CM | POA: Diagnosis not present

## 2020-03-23 NOTE — Telephone Encounter (Signed)
Pt informed of CT results per Sarah Groce, NP.  PT verbalized understanding.  Copy sent to PCP.  Order placed for 1 yr f/u CT.  

## 2020-03-24 DIAGNOSIS — M9903 Segmental and somatic dysfunction of lumbar region: Secondary | ICD-10-CM | POA: Diagnosis not present

## 2020-03-24 DIAGNOSIS — M9902 Segmental and somatic dysfunction of thoracic region: Secondary | ICD-10-CM | POA: Diagnosis not present

## 2020-03-24 DIAGNOSIS — M9901 Segmental and somatic dysfunction of cervical region: Secondary | ICD-10-CM | POA: Diagnosis not present

## 2020-03-24 DIAGNOSIS — M5032 Other cervical disc degeneration, mid-cervical region, unspecified level: Secondary | ICD-10-CM | POA: Diagnosis not present

## 2020-03-24 DIAGNOSIS — M5134 Other intervertebral disc degeneration, thoracic region: Secondary | ICD-10-CM | POA: Diagnosis not present

## 2020-03-24 DIAGNOSIS — M5136 Other intervertebral disc degeneration, lumbar region: Secondary | ICD-10-CM | POA: Diagnosis not present

## 2020-03-27 DIAGNOSIS — M5032 Other cervical disc degeneration, mid-cervical region, unspecified level: Secondary | ICD-10-CM | POA: Diagnosis not present

## 2020-03-27 DIAGNOSIS — M9903 Segmental and somatic dysfunction of lumbar region: Secondary | ICD-10-CM | POA: Diagnosis not present

## 2020-03-27 DIAGNOSIS — M5134 Other intervertebral disc degeneration, thoracic region: Secondary | ICD-10-CM | POA: Diagnosis not present

## 2020-03-27 DIAGNOSIS — M5136 Other intervertebral disc degeneration, lumbar region: Secondary | ICD-10-CM | POA: Diagnosis not present

## 2020-03-27 DIAGNOSIS — M9902 Segmental and somatic dysfunction of thoracic region: Secondary | ICD-10-CM | POA: Diagnosis not present

## 2020-03-27 DIAGNOSIS — M9901 Segmental and somatic dysfunction of cervical region: Secondary | ICD-10-CM | POA: Diagnosis not present

## 2020-03-29 DIAGNOSIS — M9903 Segmental and somatic dysfunction of lumbar region: Secondary | ICD-10-CM | POA: Diagnosis not present

## 2020-03-29 DIAGNOSIS — M5136 Other intervertebral disc degeneration, lumbar region: Secondary | ICD-10-CM | POA: Diagnosis not present

## 2020-03-29 DIAGNOSIS — M5032 Other cervical disc degeneration, mid-cervical region, unspecified level: Secondary | ICD-10-CM | POA: Diagnosis not present

## 2020-03-29 DIAGNOSIS — M9901 Segmental and somatic dysfunction of cervical region: Secondary | ICD-10-CM | POA: Diagnosis not present

## 2020-03-29 DIAGNOSIS — M9902 Segmental and somatic dysfunction of thoracic region: Secondary | ICD-10-CM | POA: Diagnosis not present

## 2020-03-29 DIAGNOSIS — M5134 Other intervertebral disc degeneration, thoracic region: Secondary | ICD-10-CM | POA: Diagnosis not present

## 2020-03-31 DIAGNOSIS — M5032 Other cervical disc degeneration, mid-cervical region, unspecified level: Secondary | ICD-10-CM | POA: Diagnosis not present

## 2020-03-31 DIAGNOSIS — M5134 Other intervertebral disc degeneration, thoracic region: Secondary | ICD-10-CM | POA: Diagnosis not present

## 2020-03-31 DIAGNOSIS — M9902 Segmental and somatic dysfunction of thoracic region: Secondary | ICD-10-CM | POA: Diagnosis not present

## 2020-03-31 DIAGNOSIS — M9903 Segmental and somatic dysfunction of lumbar region: Secondary | ICD-10-CM | POA: Diagnosis not present

## 2020-03-31 DIAGNOSIS — M5136 Other intervertebral disc degeneration, lumbar region: Secondary | ICD-10-CM | POA: Diagnosis not present

## 2020-03-31 DIAGNOSIS — M9901 Segmental and somatic dysfunction of cervical region: Secondary | ICD-10-CM | POA: Diagnosis not present

## 2020-04-03 DIAGNOSIS — M5136 Other intervertebral disc degeneration, lumbar region: Secondary | ICD-10-CM | POA: Diagnosis not present

## 2020-04-03 DIAGNOSIS — M9903 Segmental and somatic dysfunction of lumbar region: Secondary | ICD-10-CM | POA: Diagnosis not present

## 2020-04-03 DIAGNOSIS — M9901 Segmental and somatic dysfunction of cervical region: Secondary | ICD-10-CM | POA: Diagnosis not present

## 2020-04-03 DIAGNOSIS — M5032 Other cervical disc degeneration, mid-cervical region, unspecified level: Secondary | ICD-10-CM | POA: Diagnosis not present

## 2020-04-03 DIAGNOSIS — M5134 Other intervertebral disc degeneration, thoracic region: Secondary | ICD-10-CM | POA: Diagnosis not present

## 2020-04-03 DIAGNOSIS — M9902 Segmental and somatic dysfunction of thoracic region: Secondary | ICD-10-CM | POA: Diagnosis not present

## 2020-04-05 ENCOUNTER — Ambulatory Visit: Payer: Self-pay

## 2020-04-05 ENCOUNTER — Other Ambulatory Visit: Payer: Self-pay

## 2020-04-05 ENCOUNTER — Ambulatory Visit: Payer: PPO | Admitting: Orthopaedic Surgery

## 2020-04-05 ENCOUNTER — Other Ambulatory Visit: Payer: Self-pay | Admitting: Orthopaedic Surgery

## 2020-04-05 ENCOUNTER — Encounter: Payer: Self-pay | Admitting: Orthopaedic Surgery

## 2020-04-05 VITALS — Ht 71.0 in | Wt 165.0 lb

## 2020-04-05 DIAGNOSIS — M545 Low back pain, unspecified: Secondary | ICD-10-CM | POA: Diagnosis not present

## 2020-04-05 DIAGNOSIS — M5442 Lumbago with sciatica, left side: Secondary | ICD-10-CM

## 2020-04-05 DIAGNOSIS — G8929 Other chronic pain: Secondary | ICD-10-CM

## 2020-04-05 MED ORDER — METHYLPREDNISOLONE 4 MG PO TBPK: ORAL_TABLET | ORAL | 0 refills | Status: DC

## 2020-04-05 MED FILL — METHYLPREDNISOLONE 4 MG TBP: 4 | 6 days supply | Qty: 21 | Fill #0

## 2020-04-05 NOTE — Progress Notes (Signed)
Office Visit Note   Patient: Russell Cox           Date of Birth: [birth year]           MRN: [id number] Visit Date: 04/05/2020              Requested by: Caren Macadam, MD [place],  [place] [id number] PCP: Caren Macadam, MD   Assessment & Plan: Visit Diagnoses:  1. Acute left-sided low back pain, unspecified whether sciatica present   2. Chronic left-sided low back pain with left-sided sciatica     Plan: Hope injured his low back about a month and a half ago while lifting some fertilizer bags.  Pain has been mostly in his back but has had some referred pain to his left thigh.  Has been to the chiropractor for up to 1 month but notes he is really not "improving".  Pain does travel down his left leg as far distally as his left knee.  There is no numbness or tingling or weakness.  Ibuprofen has not helped.  In the last 24 hours he has had some pain in his "shin".  He is also had some trouble sleeping.  Films demonstrate degenerative changes lumbar spine.  I am going to place him on a Medrol Dosepak and precertify an MRI scan of his lumbar spine  Follow-Up Instructions: No follow-ups on file.   Orders:  Orders Placed This Encounter  Procedures  . XR Lumbar Spine 2-3 Views  . MR Lumbar Spine w/o contrast   Meds ordered this encounter  Medications  . methylPREDNISolone (MEDROL DOSEPAK) 4 MG TBPK tablet    Sig: Take as directed on package    Dispense:  21 tablet    Refill:  0      Procedures: No procedures performed   Clinical Data: No additional findings.   Subjective: Chief Complaint  Patient presents with  . Lower Back - Pain  Patient presents today for lower back pain. He said that he injured it over a month ago while lifting fertilizer bags. He has been going to a chiropractor, but not noticing any improvement. His pain travels down to his knee in his left leg. No numbness, tingling, or weakness. He has been taking Ibuprofen but states that  it does not help. No previous spine surgery.   HPI  Review of Systems   Objective: Vital Signs: Ht 5\' 11"  (1.803 m)   Wt 165 lb (74.8 kg)   BMI 23.01 kg/m   Physical Exam Constitutional:      Appearance: He is well-developed.  Eyes:     Pupils: Pupils are equal, round, and reactive to light.  Pulmonary:     Effort: Pulmonary effort is normal.  Skin:    General: Skin is warm and dry.  Neurological:     Mental Status: He is alert and oriented to person, place, and time.  Psychiatric:        Behavior: Behavior normal.     Ortho Exam awake alert and oriented x3.  Comfortable sitting.  Straight leg raise is positive on the left for low back pain but not leg pain.  Negative on the right.  Reflexes are symmetrical.  No distal edema.  Neurologically intact.  Good capillary refill to the toes.  Painless range of motion both hips.  Has had a prior diagnosis of osteoarthritis of his right knee that has not been symptomatic recently.  I felt some osteophytes along the  medial compartment of his right knee probably consistent with arthritis but no pain Specialty Comments:  No specialty comments available.  Imaging: XR Lumbar Spine 2-3 Views  Result Date: 04/05/2020 Films of the lumbar spine were obtained in 2 projections.  There are facet joint degenerative changes at L4-5 and L5-S1 and some anterior osteophytes along the lower lumbar vertebrae.  Disc space appear to be well-maintained.  No listhesis.  Very minimal degenerative scoliosis to the right.  Diffuse calcification of the abdominal aorta and iliac arteries but no obvious aneurysmal dilatation    PMFS History: Patient Active Problem List   Diagnosis Date Noted  . Low back pain 04/05/2020  . Impaired glucose tolerance 08/05/2014  . History of colonic polyps 08/05/2014  . Dyslipidemia 03/25/2011  . Tobacco abuse 03/25/2011  . Hypertension 12/05/2010   Past Medical History:  Diagnosis Date  . Cataract    removed  bilaterally  . Cigarette smoker   . COPD (chronic obstructive pulmonary disease) ([place])   . Hyperlipidemia   . Hypertension   . SINUSITIS, ACUTE MAXILLARY 02/24/2007  . URI 05/28/2007    Family History  Problem Relation Age of Onset  . Cancer Mother        unknown what type  . Colon cancer Mother 69  . Arthritis Mother   . Heart disease Father   . Cancer Father        thyroid  . Hypertension Sister   . Hypertension Brother   . Bladder Cancer Brother 56  . Diabetes Maternal Grandmother   . Cancer Maternal Grandfather        lung  . Colon polyps Neg Hx   . Esophageal cancer Neg Hx   . Rectal cancer Neg Hx   . Stomach cancer Neg Hx     Past Surgical History:  Procedure Laterality Date  . CATARACT EXTRACTION W/ INTRAOCULAR LENS IMPLANT Bilateral    left  . COLONOSCOPY  03/2016   repeat in 5 years  . ELBOW SURGERY  1988   tennis  . hemmorrhoidectomy  1988  . POLYPECTOMY    . SHOULDER ARTHROSCOPY W/ ACROMIAL REPAIR     left   Social History   Occupational History  . Not on file  Tobacco Use  . Smoking status: Current Every Day Smoker    Packs/day: 1.00    Years: 54.00    Pack years: 54.00    Types: Cigarettes    Start date: 55  . Smokeless tobacco: Never Used  Vaping Use  . Vaping Use: Never used  Substance and Sexual Activity  . Alcohol use: Yes    Comment: socially  . Drug use: No  . Sexual activity: Not on file

## 2020-04-24 ENCOUNTER — Ambulatory Visit: Payer: PPO | Admitting: Dermatology

## 2020-04-24 MED FILL — ATORVASTATIN 40 MG TABLET: 40 | 90 days supply | Qty: 90 | Fill #1

## 2020-05-12 ENCOUNTER — Other Ambulatory Visit (HOSPITAL_COMMUNITY): Payer: Self-pay | Admitting: Family Medicine

## 2020-05-12 ENCOUNTER — Encounter: Payer: PPO | Admitting: Family Medicine

## 2020-05-12 DIAGNOSIS — R059 Cough, unspecified: Secondary | ICD-10-CM | POA: Diagnosis not present

## 2020-05-12 MED FILL — predniSONE 5 MG (21) TBPK: 5 | 6 days supply | Qty: 21 | Fill #0

## 2020-05-12 MED FILL — AZITHROMYCIN 250 MG TABLET: 250 | 5 days supply | Qty: 6 | Fill #0

## 2020-05-15 DIAGNOSIS — R059 Cough, unspecified: Secondary | ICD-10-CM | POA: Diagnosis not present

## 2020-05-15 MED FILL — LOSARTAN-HCTZ 100-12.5 MG T: 100-12.5 | 60 days supply | Qty: 60 | Fill #1

## 2020-05-25 ENCOUNTER — Telehealth: Payer: Self-pay | Admitting: Family Medicine

## 2020-05-25 NOTE — Telephone Encounter (Signed)
Left message for patient to call back and schedule Medicare Annual Wellness Visit (AWV) either virtually or in office.   Last AWV no information please schedule at anytime with [facility] Nurse Health Advisor 1 or 2   This should be a 45 minute visit. 

## 2020-06-12 ENCOUNTER — Encounter: Payer: Self-pay | Admitting: Orthopaedic Surgery

## 2020-06-12 ENCOUNTER — Other Ambulatory Visit: Payer: Self-pay

## 2020-06-12 MED ORDER — METHYLPREDNISOLONE 4 MG PO TBPK: ORAL_TABLET | ORAL | 0 refills | Status: DC

## 2020-06-12 MED FILL — METHYLPREDNISOLONE 4 MG TBP: 4 | 6 days supply | Qty: 21 | Fill #0

## 2020-06-12 NOTE — Telephone Encounter (Signed)
Ok to renew medrol dose pack

## 2020-06-12 NOTE — Progress Notes (Signed)
Medrol

## 2020-06-13 ENCOUNTER — Ambulatory Visit (INDEPENDENT_AMBULATORY_CARE_PROVIDER_SITE_OTHER): Payer: PPO

## 2020-06-13 ENCOUNTER — Other Ambulatory Visit: Payer: Self-pay

## 2020-06-13 DIAGNOSIS — Z0000 Encounter for general adult medical examination without abnormal findings: Secondary | ICD-10-CM

## 2020-06-13 NOTE — Progress Notes (Signed)
Virtual Visit via Telephone Note  I connected with  Russell Cox on 06/13/20 at  2:30 PM EST by telephone and verified that I am speaking with the correct person using two identifiers.  Medicare Annual Wellness visit completed telephonically due to Covid-19 pandemic.   Persons participating in this call: This Health Coach and this patient.   Location: Patient: Home Provider: Office   I discussed the limitations, risks, security and privacy concerns of performing an evaluation and management service by telephone and the availability of in person appointments. The patient expressed understanding and agreed to proceed.  Unable to perform video visit due to video visit attempted and failed and/or patient does not have video capability.   Some vital signs may be absent or patient reported.   Russell Brace, LPN    Subjective:   Russell Cox is a 70 y.o. male who presents for an Initial Medicare Annual Wellness Visit.  Review of Systems     Cardiac Risk Factors include: advanced age (>40men, >33 women);dyslipidemia;hypertension;smoking/ tobacco exposure;male gender     Objective:    There were no vitals filed for this visit. There is no height or weight on file to calculate BMI.  Advanced Directives 06/13/2020 03/05/2016  Does Patient Have a Medical Advance Directive? Yes Yes  Type of Paramedic of [place];Living will [place];Living will  Copy of [place] in Chart? No - copy requested -    Current Medications (verified) Outpatient Encounter Medications as of 06/13/2020  Medication Sig  . aspirin 81 MG tablet Take 1 tablet (81 mg total) daily by mouth.  Russell Cox Kitchen atorvastatin (LIPITOR) 40 MG tablet TAKE 1 TABLET BY MOUTH ONCE DAILY **NEEDS APPOINTMENT FOR FURTHER REFILLS**  . losartan-hydrochlorothiazide (HYZAAR) 100-12.5 MG tablet TAKE 1 TABLET BY MOUTH ONCE DAILY  . methylPREDNISolone (MEDROL DOSEPAK) 4 MG TBPK tablet Take  as directed on package  . sildenafil (VIAGRA) 100 MG tablet TAKE 1 TABLET BY MOUTH WHEN NEEDED FOR ERECTILE DYSFUNCTION  . EPINEPHrine 0.3 mg/0.3 mL IJ SOAJ injection Inject into the muscle.  Russell Cox Kitchen FLUAD QUADRIVALENT 0.5 ML injection   . [DISCONTINUED] methylPREDNISolone (MEDROL DOSEPAK) 4 MG TBPK tablet Take as directed on package.   No facility-administered encounter medications on file as of 06/13/2020.    Allergies (verified) Bee venom and Penicillins   History: Past Medical History:  Diagnosis Date  . Cataract    removed bilaterally  . Cigarette smoker   . COPD (chronic obstructive pulmonary disease) ([place])   . Hyperlipidemia   . Hypertension   . SINUSITIS, ACUTE MAXILLARY 02/24/2007  . URI 05/28/2007   Past Surgical History:  Procedure Laterality Date  . CATARACT EXTRACTION W/ INTRAOCULAR LENS IMPLANT Bilateral    left  . COLONOSCOPY  03/2016   repeat in 5 years  . ELBOW SURGERY  1988   tennis  . hemmorrhoidectomy  1988  . POLYPECTOMY    . SHOULDER ARTHROSCOPY W/ ACROMIAL REPAIR     left   Family History  Problem Relation Age of Onset  . Cancer Mother        unknown what type  . Colon cancer Mother 26  . Arthritis Mother   . Heart disease Father   . Cancer Father        thyroid  . Hypertension Sister   . Hypertension Brother   . Bladder Cancer Brother 89  . Diabetes Maternal Grandmother   . Cancer Maternal Grandfather  lung  . Colon polyps Neg Hx   . Esophageal cancer Neg Hx   . Rectal cancer Neg Hx   . Stomach cancer Neg Hx    Social History   Socioeconomic History  . Marital status: Married    Spouse name: Not on file  . Number of children: Not on file  . Years of education: Not on file  . Highest education level: Not on file  Occupational History  . Not on file  Tobacco Use  . Smoking status: Current Every Day Smoker    Packs/day: 1.00    Years: 54.00    Pack years: 54.00    Types: Cigarettes    Start date: 59  . Smokeless tobacco:  Never Used  Vaping Use  . Vaping Use: Never used  Substance and Sexual Activity  . Alcohol use: Yes    Comment: socially  . Drug use: No  . Sexual activity: Not on file  Other Topics Concern  . Not on file  Social History Narrative  . Not on file   Social Determinants of Health   Financial Resource Strain: Low Risk   . Difficulty of Paying Living Expenses: Not hard at all  Food Insecurity: No Food Insecurity  . Worried About Programme researcher, broadcasting/film/video in the Last Year: Never true  . Ran Out of Food in the Last Year: Never true  Transportation Needs: No Transportation Needs  . Lack of Transportation (Medical): No  . Lack of Transportation (Non-Medical): No  Physical Activity: Inactive  . Days of Exercise per Week: 0 days  . Minutes of Exercise per Session: 0 min  Stress: No Stress Concern Present  . Feeling of Stress : Not at all  Social Connections: Moderately Isolated  . Frequency of Communication with Friends and Family: More than three times a week  . Frequency of Social Gatherings with Friends and Family: More than three times a week  . Attends Religious Services: Never  . Active Member of Clubs or Organizations: No  . Attends Banker Meetings: Never  . Marital Status: Married    Tobacco Counseling Ready to quit: Not Answered Counseling given: Not Answered   Clinical Intake:  Pre-visit preparation completed: Yes  Pain : No/denies pain     BMI - recorded: 23.02 Nutritional Status: BMI of 19-24  Normal Nutritional Risks: None Diabetes: No  How often do you need to have someone help you when you read instructions, pamphlets, or other written materials from your doctor or pharmacy?: 1 - Never  Diabetic?No  Interpreter Needed?: No  Information entered by :: Russell Ensign, LPN   Activities of Daily Living In your present state of health, do you have any difficulty performing the following activities: 06/13/2020  Hearing? N  Vision? N  Difficulty  concentrating or making decisions? N  Walking or climbing stairs? N  Dressing or bathing? N  Doing errands, shopping? N  Preparing Food and eating ? N  Using the Toilet? N  In the past six months, have you accidently leaked urine? N  Do you have problems with loss of bowel control? N  Managing your Medications? N  Managing your Finances? N  Housekeeping or managing your Housekeeping? N  Some recent data might be hidden    Patient Care Team: Wynn Banker, MD as PCP - General (Family Medicine) Janalyn Harder, MD as Consulting Physician (Dermatology)  Indicate any recent Medical Services you may have received from other than [facility] providers in the  past year (date may be approximate).     Assessment:   This is a routine wellness examination for [facility].  Hearing/Vision screen  Hearing Screening   125Hz  250Hz  500Hz  1000Hz  2000Hz  3000Hz  4000Hz  6000Hz  8000Hz   Right ear:           Left ear:           Comments: Pt denies any hearing issues   Vision Screening Comments: Pt follows Dr for annual eye care  Dietary issues and exercise activities discussed: Current Exercise Habits: The patient does not participate in regular exercise at present, Exercise limited by: orthopedic condition(s)  Goals    . Patient Stated     None at this time      Depression Screen PHQ 2/9 Scores 06/13/2020 03/03/2019 08/05/2014 07/30/2013  PHQ - 2 Score 0 0 0 0    Fall Risk Fall Risk  06/13/2020 03/03/2019 08/05/2014 07/30/2013  Falls in the past year? 0 0 No No  Number falls in past yr: 0 0 - -  Injury with Fall? 0 0 - -  Risk for fall due to : Impaired vision - - -  Follow up Falls prevention discussed - - -    FALL RISK PREVENTION PERTAINING TO THE HOME:  Any stairs in or around the home? Yes  If so, are there any without handrails? No  Home free of loose throw rugs in walkways, pet beds, electrical cords, etc? Yes  Adequate lighting in your home to reduce risk of falls? Yes   ASSISTIVE  DEVICES UTILIZED TO PREVENT FALLS:  Life alert? No  Use of a cane, walker or w/c? No  Grab bars in the bathroom? No  Shower chair or bench in shower? Yes  Elevated toilet seat or a handicapped toilet? No   TIMED UP AND GO:  Was the test performed? No .     Cognitive Function:     6CIT Screen 06/13/2020  What Year? 0 points  What month? 0 points  Count back from 20 0 points  Months in reverse 0 points  Repeat phrase 0 points    Immunizations Immunization History  Administered Date(s) Administered  . Fluad Quad(high Dose 65+) 03/03/2019, 03/15/2020  . Influenza Split 03/25/2011, 04/20/2012  . Influenza Whole 05/28/2007  . Influenza, High Dose Seasonal PF 04/15/2017  . Influenza,inj,Quad PF,6+ Mos 04/06/2013  . Influenza-Unspecified 03/10/2014  . PFIZER SARS-COV-2 Vaccination 07/23/2019, 08/14/2019, 04/11/2020  . Pneumococcal Conjugate-13 08/05/2014  . Pneumococcal Polysaccharide-23 04/15/2017  . Tdap 03/25/2011  . Zoster Recombinat (Shingrix) 03/17/2018, 07/27/2018, 12/08/2018    TDAP status: Up to date  Flu Vaccine status: Up to date  Pneumococcal vaccine status: Up to date  Covid-19 vaccine status: Completed vaccines  Qualifies for Shingles Vaccine? Yes   Zostavax completed Yes   Shingrix Completed?: Yes  Screening Tests Health Maintenance  Topic Date Due  . COVID-19 Vaccine (4 - Booster for Pfizer series) 10/09/2020  . COLONOSCOPY (Pts 45-30yrs Insurance coverage will need to be confirmed)  04/04/2022  . TETANUS/TDAP  01/06/2030  . INFLUENZA VACCINE  Completed  . Hepatitis C Screening  Completed  . PNA vac Low Risk Adult  Completed    Health Maintenance  There are no preventive care reminders to display for this patient.  Colorectal cancer screening: Type of screening: Colonoscopy. Completed 04/05/19. Repeat every 3 years  Lung Cancer Screening: (Low Dose CT Chest recommended if Age 18-80 years, 30 pack-year currently smoking OR have quit w/in  15years.) does qualify.  Lung Cancer Screening Referral: per provider  Additional Screening:  Hepatitis C Screening: Completed 04/15/17  Vision Screening: Recommended annual ophthalmology exams for early detection of glaucoma and other disorders of the eye. Is the patient up to date with their annual eye exam?  Yes  Who is the provider or what is the name of the office in which the patient attends annual eye exams? [facility]  Dental Screening: Recommended annual dental exams for proper oral hygiene  Community Resource Referral / Chronic Care Management: CRR required this visit?  No   CCM required this visit?  No      Plan:     I have personally reviewed and noted the following in the patient's chart:   . Medical and social history . Use of alcohol, tobacco or illicit drugs  . Current medications and supplements . Functional ability and status . Nutritional status . Physical activity . Advanced directives . List of other physicians . Hospitalizations, surgeries, and ER visits in previous 12 months . Vitals . Screenings to include cognitive, depression, and falls . Referrals and appointments  In addition, I have reviewed and discussed with patient certain preventive protocols, quality metrics, and best practice recommendations. A written personalized care plan for preventive services as well as general preventive health recommendations were provided to patient.     Russell Schlein, LPN   11/15/1273   Nurse Notes: None

## 2020-06-13 NOTE — Patient Instructions (Addendum)
[name] , Thank you for taking time to come for your Medicare Wellness Visit. I appreciate your ongoing commitment to your health goals. Please review the following plan we discussed and let me know if I can assist you in the future.   Screening recommendations/referrals: Colonoscopy: Done 04/05/19 Recommended yearly ophthalmology/optometry visit for glaucoma screening and checkup Recommended yearly dental visit for hygiene and checkup  Vaccinations: Influenza vaccine: Done 03/15/20 Pneumococcal vaccine: Up to date Tdap vaccine: Up to date Shingles vaccine: Completed 03/17/18, 2/17 7 12/08/18   Covid-19: Completed 2/12, 3/6, & 04/11/20  Advanced directives: Please bring a copy of your health care power of attorney and living will to the office at your convenience.  Conditions/risks identified: None at this time  Next appointment: Follow up in one year for your annual wellness visit.   Preventive Care 72 Years and Older, Male Preventive care refers to lifestyle choices and visits with your health care provider that can promote health and wellness. What does preventive care include?  A yearly physical exam. This is also called an annual well check.  Dental exams once or twice a year.  Routine eye exams. Ask your health care provider how often you should have your eyes checked.  Personal lifestyle choices, including:  Daily care of your teeth and gums.  Regular physical activity.  Eating a healthy diet.  Avoiding tobacco and drug use.  Limiting alcohol use.  Practicing safe sex.  Taking low doses of aspirin every day.  Taking vitamin and mineral supplements as recommended by your health care provider. What happens during an annual well check? The services and screenings done by your health care provider during your annual well check will depend on your age, overall health, lifestyle risk factors, and family history of disease. Counseling  Your health care provider may  ask you questions about your:  Alcohol use.  Tobacco use.  Drug use.  Emotional well-being.  Home and relationship well-being.  Sexual activity.  Eating habits.  History of falls.  Memory and ability to understand (cognition).  Work and work Astronomer. Screening  You may have the following tests or measurements:  Height, weight, and BMI.  Blood pressure.  Lipid and cholesterol levels. These may be checked every 5 years, or more frequently if you are over 72 years old.  Skin check.  Lung cancer screening. You may have this screening every year starting at age 72 if you have a 30-pack-year history of smoking and currently smoke or have quit within the past 15 years.  Fecal occult blood test (FOBT) of the stool. You may have this test every year starting at age 72.  Flexible sigmoidoscopy or colonoscopy. You may have a sigmoidoscopy every 5 years or a colonoscopy every 10 years starting at age 72.  Prostate cancer screening. Recommendations will vary depending on your family history and other risks.  Hepatitis C blood test.  Hepatitis B blood test.  Sexually transmitted disease (STD) testing.  Diabetes screening. This is done by checking your blood sugar (glucose) after you have not eaten for a while (fasting). You may have this done every 1-3 years.  Abdominal aortic aneurysm (AAA) screening. You may need this if you are a current or former smoker.  Osteoporosis. You may be screened starting at age 72 if you are at high risk. Talk with your health care provider about your test results, treatment options, and if necessary, the need for more tests. Vaccines  Your health care provider may recommend  certain vaccines, such as:  Influenza vaccine. This is recommended every year.  Tetanus, diphtheria, and acellular pertussis (Tdap, Td) vaccine. You may need a Td booster every 10 years.  Zoster vaccine. You may need this after age 72.  Pneumococcal 13-valent  conjugate (PCV13) vaccine. One dose is recommended after age 72.  Pneumococcal polysaccharide (PPSV23) vaccine. One dose is recommended after age 72. Talk to your health care provider about which screenings and vaccines you need and how often you need them. This information is not intended to replace advice given to you by your health care provider. Make sure you discuss any questions you have with your health care provider. Document Released: 06/23/2015 Document Revised: 02/14/2016 Document Reviewed: 03/28/2015 Elsevier Interactive Patient Education  2017 [employer] Prevention in the Home Falls can cause injuries. They can happen to people of all ages. There are many things you can do to make your home safe and to help prevent falls. What can I do on the outside of my home?  Regularly fix the edges of walkways and driveways and fix any cracks.  Remove anything that might make you trip as you walk through a door, such as a raised step or threshold.  Trim any bushes or trees on the path to your home.  Use bright outdoor lighting.  Clear any walking paths of anything that might make someone trip, such as rocks or tools.  Regularly check to see if handrails are loose or broken. Make sure that both sides of any steps have handrails.  Any raised decks and porches should have guardrails on the edges.  Have any leaves, snow, or ice cleared regularly.  Use sand or salt on walking paths during winter.  Clean up any spills in your garage right away. This includes oil or grease spills. What can I do in the bathroom?  Use night lights.  Install grab bars by the toilet and in the tub and shower. Do not use towel bars as grab bars.  Use non-skid mats or decals in the tub or shower.  If you need to sit down in the shower, use a plastic, non-slip stool.  Keep the floor dry. Clean up any water that spills on the floor as soon as it happens.  Remove soap buildup in the tub or  shower regularly.  Attach bath mats securely with double-sided non-slip rug tape.  Do not have throw rugs and other things on the floor that can make you trip. What can I do in the bedroom?  Use night lights.  Make sure that you have a light by your bed that is easy to reach.  Do not use any sheets or blankets that are too big for your bed. They should not hang down onto the floor.  Have a firm chair that has side arms. You can use this for support while you get dressed.  Do not have throw rugs and other things on the floor that can make you trip. What can I do in the kitchen?  Clean up any spills right away.  Avoid walking on wet floors.  Keep items that you use a lot in easy-to-reach places.  If you need to reach something above you, use a strong step stool that has a grab bar.  Keep electrical cords out of the way.  Do not use floor polish or wax that makes floors slippery. If you must use wax, use non-skid floor wax.  Do not have throw rugs and other  things on the floor that can make you trip. What can I do with my stairs?  Do not leave any items on the stairs.  Make sure that there are handrails on both sides of the stairs and use them. Fix handrails that are broken or loose. Make sure that handrails are as long as the stairways.  Check any carpeting to make sure that it is firmly attached to the stairs. Fix any carpet that is loose or worn.  Avoid having throw rugs at the top or bottom of the stairs. If you do have throw rugs, attach them to the floor with carpet tape.  Make sure that you have a light switch at the top of the stairs and the bottom of the stairs. If you do not have them, ask someone to add them for you. What else can I do to help prevent falls?  Wear shoes that:  Do not have high heels.  Have rubber bottoms.  Are comfortable and fit you well.  Are closed at the toe. Do not wear sandals.  If you use a stepladder:  Make sure that it is fully  opened. Do not climb a closed stepladder.  Make sure that both sides of the stepladder are locked into place.  Ask someone to hold it for you, if possible.  Clearly mark and make sure that you can see:  Any grab bars or handrails.  First and last steps.  Where the edge of each step is.  Use tools that help you move around (mobility aids) if they are needed. These include:  Canes.  Walkers.  Scooters.  Crutches.  Turn on the lights when you go into a dark area. Replace any light bulbs as soon as they burn out.  Set up your furniture so you have a clear path. Avoid moving your furniture around.  If any of your floors are uneven, fix them.  If there are any pets around you, be aware of where they are.  Review your medicines with your doctor. Some medicines can make you feel dizzy. This can increase your chance of falling. Ask your doctor what other things that you can do to help prevent falls. This information is not intended to replace advice given to you by your health care provider. Make sure you discuss any questions you have with your health care provider. Document Released: 03/23/2009 Document Revised: 11/02/2015 Document Reviewed: 07/01/2014 Elsevier Interactive Patient Education  2017 [employer].

## 2020-06-30 ENCOUNTER — Encounter: Payer: PPO | Admitting: Family Medicine

## 2020-07-12 MED FILL — LOSARTAN-HCTZ 100-12.5 MG T: 100-12.5 | 60 days supply | Qty: 60 | Fill #2

## 2020-08-01 ENCOUNTER — Other Ambulatory Visit: Payer: Self-pay

## 2020-08-02 ENCOUNTER — Ambulatory Visit (INDEPENDENT_AMBULATORY_CARE_PROVIDER_SITE_OTHER): Payer: PPO | Admitting: Family Medicine

## 2020-08-02 ENCOUNTER — Encounter: Payer: Self-pay | Admitting: Family Medicine

## 2020-08-02 ENCOUNTER — Other Ambulatory Visit: Payer: Self-pay | Admitting: Family Medicine

## 2020-08-02 VITALS — BP 150/80 | HR 81 | Temp 98.3°F | Resp 81 | Ht 72.5 in | Wt 170.5 lb

## 2020-08-02 DIAGNOSIS — I10 Essential (primary) hypertension: Secondary | ICD-10-CM

## 2020-08-02 DIAGNOSIS — R7302 Impaired glucose tolerance (oral): Secondary | ICD-10-CM | POA: Diagnosis not present

## 2020-08-02 DIAGNOSIS — E785 Hyperlipidemia, unspecified: Secondary | ICD-10-CM

## 2020-08-02 DIAGNOSIS — Z0000 Encounter for general adult medical examination without abnormal findings: Secondary | ICD-10-CM

## 2020-08-02 MED ORDER — ATORVASTATIN CALCIUM 40 MG PO TABS: ORAL_TABLET | ORAL | 1 refills | Status: DC

## 2020-08-02 MED ORDER — METHYLPREDNISOLONE 4 MG PO TBPK
ORAL_TABLET | ORAL | 0 refills | Status: DC
Start: 2020-08-02 — End: 2020-08-02

## 2020-08-02 MED ORDER — LOSARTAN POTASSIUM-HCTZ 100-25 MG PO TABS: 1.0000 | ORAL_TABLET | Freq: Every day | ORAL | 1 refills | Status: DC

## 2020-08-02 MED FILL — ATORVASTATIN 40 MG TABLET: 40 | 90 days supply | Qty: 90 | Fill #0

## 2020-08-02 MED FILL — LOSARTAN-HCTZ 100-25 MG TAB: 100-25 | 30 days supply | Qty: 30 | Fill #0

## 2020-08-02 NOTE — Progress Notes (Signed)
Russell Cox DOB: December 08, 1950 Encounter date: 08/02/2020  This is a 70 y.o. male who presents for complete physical   History of present illness/Additional concerns:  HTN: pressures at home 093-267 systolic.   Hurt back, went to chiropractor; didn't help this time. Saw ortho - got rx for prednisone - which helped back, but now hip bothering him. Does feel like hip is getting better. No radiation of pain - more lateral.    chantix didn't work well for him.  Past Medical History:  Diagnosis Date  . Cataract    removed bilaterally  . Cigarette smoker   . COPD (chronic obstructive pulmonary disease) ([place])   . Hyperlipidemia   . Hypertension   . SINUSITIS, ACUTE MAXILLARY 02/24/2007  . URI 05/28/2007   Past Surgical History:  Procedure Laterality Date  . CATARACT EXTRACTION W/ INTRAOCULAR LENS IMPLANT Bilateral    left  . COLONOSCOPY  03/2016   repeat in 5 years  . ELBOW SURGERY  1988   tennis  . hemmorrhoidectomy  1988  . POLYPECTOMY    . SHOULDER ARTHROSCOPY W/ ACROMIAL REPAIR     left   Allergies  Allergen Reactions  . Bee Venom Anaphylaxis  . Penicillins Hives   Current Meds  Medication Sig  . aspirin 81 MG tablet Take 1 tablet (81 mg total) daily by mouth.  . EPINEPHrine 0.3 mg/0.3 mL IJ SOAJ injection Inject into the muscle.  Marland Kitchen FLUAD QUADRIVALENT 0.5 ML injection   . losartan-hydrochlorothiazide (HYZAAR) 100-25 MG tablet Take 1 tablet by mouth daily.  . [DISCONTINUED] atorvastatin (LIPITOR) 40 MG tablet TAKE 1 TABLET BY MOUTH ONCE DAILY **NEEDS APPOINTMENT FOR FURTHER REFILLS**  . [DISCONTINUED] losartan-hydrochlorothiazide (HYZAAR) 100-12.5 MG tablet TAKE 1 TABLET BY MOUTH ONCE DAILY   Social History   Tobacco Use  . Smoking status: Current Every Day Smoker    Packs/day: 1.00    Years: 54.00    Pack years: 54.00    Types: Cigarettes    Start date: 89  . Smokeless tobacco: Never Used  Substance Use Topics  . Alcohol use: Yes    Comment: socially    Family History  Problem Relation Age of Onset  . Cancer Mother        unknown what type  . Colon cancer Mother 21  . Arthritis Mother   . Heart disease Father   . Cancer Father        thyroid  . Hypertension Sister   . Hypertension Brother   . Bladder Cancer Brother 54  . Diabetes Maternal Grandmother   . Cancer Maternal Grandfather        lung  . Colon polyps Neg Hx   . Esophageal cancer Neg Hx   . Rectal cancer Neg Hx   . Stomach cancer Neg Hx      Review of Systems  CBC:  Lab Results  Component Value Date   WBC 6.1 12/08/2019   HGB 14.0 12/08/2019   HCT 41.0 12/08/2019   MCHC 34.2 12/08/2019   RDW 13.2 12/08/2019   PLT 194.0 12/08/2019   CMP: Lab Results  Component Value Date   NA 134 (L) 03/06/2020   K 4.1 03/06/2020   CL 99 03/06/2020   CO2 26 03/06/2020   GLUCOSE 111 (H) 03/06/2020   BUN 18 03/06/2020   CREATININE 1.25 (H) 03/06/2020   GFRAA 111 12/21/2007   CALCIUM 9.5 03/06/2020   PROT 7.0 03/06/2020   BILITOT 0.4 03/06/2020   ALKPHOS 60 12/08/2019  ALT 17 03/06/2020   AST 21 03/06/2020   LIPID: Lab Results  Component Value Date   CHOL 161 12/08/2019   TRIG 148.0 12/08/2019   HDL 72.30 12/08/2019   LDLCALC 59 12/08/2019    Objective:  BP (!) 150/80 (BP Location: Left Arm, Patient Position: Sitting, Cuff Size: Normal)   Pulse 81   Temp 98.3 F (36.8 C) (Oral)   Resp (!) 81   Ht 6' 0.5" (1.842 m)   Wt 170 lb 8 oz (77.3 kg)   BMI 22.81 kg/m   Weight: 170 lb 8 oz (77.3 kg)   BP Readings from Last 3 Encounters:  08/02/20 (!) 150/80  12/08/19 (!) 120/58  04/07/19 140/76   Wt Readings from Last 3 Encounters:  08/02/20 170 lb 8 oz (77.3 kg)  04/05/20 165 lb (74.8 kg)  12/08/19 162 lb 4.8 oz (73.6 kg)    Physical Exam Constitutional:      General: He is not in acute distress.    Appearance: He is well-developed and well-nourished.  HENT:     Head: Normocephalic and atraumatic.     Right Ear: External ear normal.     Left  Ear: External ear normal.     Nose: Nose normal.     Mouth/Throat:     Mouth: Oropharynx is clear and moist.     Pharynx: No oropharyngeal exudate.  Eyes:     Conjunctiva/sclera: Conjunctivae normal.     Pupils: Pupils are equal, round, and reactive to light.  Neck:     Thyroid: No thyromegaly.  Cardiovascular:     Rate and Rhythm: Normal rate and regular rhythm.     Pulses: Intact distal pulses.     Heart sounds: Normal heart sounds. No murmur heard. No friction rub. No gallop.   Pulmonary:     Effort: Pulmonary effort is normal. No respiratory distress.     Breath sounds: Normal breath sounds. No stridor. No wheezing or rales.  Abdominal:     General: Bowel sounds are normal.     Palpations: Abdomen is soft.  Musculoskeletal:        General: Normal range of motion.     Cervical back: Neck supple.     Comments: Mild hip discomfort with internal rotation; otherwise symmetric with ROM. No other tenderness on exam.  Skin:    General: Skin is warm and dry.  Neurological:     Mental Status: He is alert and oriented to person, place, and time.  Psychiatric:        Mood and Affect: Mood and affect normal.        Behavior: Behavior normal.        Thought Content: Thought content normal.        Judgment: Judgment normal.     Assessment/Plan: There are no preventive care reminders to display for this patient. Health Maintenance reviewed.  1. Preventative health care Get back to regular activity level.   2. Primary hypertension Elevated. We are going to increase the losartan -hctz to 100-25; he will update me in a couple of weeks.  - CBC with Differential/Platelet; Future - Comprehensive metabolic panel; Future  3. Impaired glucose tolerance Will recheck today. He was on prednisone which may have affected this. - Hemoglobin A1c; Future  4. Dyslipidemia Has been well controlled on lipitor - Lipid panel; Future - atorvastatin (LIPITOR) 40 MG tablet; TAKE 1 TABLET BY MOUTH  ONCE DAILY  Dispense: 90 tablet; Refill: 1  Plan is to quit  smoking around May; when planning retirement.  Return in about 6 months (around 01/30/2021) for Chronic condition visit.  [name], MD

## 2020-08-02 NOTE — Addendum Note (Signed)
Addended by: Marrion Coy on: 08/02/2020 02:23 PM   Modules accepted: Orders

## 2020-08-02 NOTE — Patient Instructions (Signed)
We are increasing the losartan-hctz to 100-25mg  daily. Monitor pressures at home and let me know if you are not routinely seeing your pressures below 626 systolic.   If hip not better in 2 weeks time - then you can start the prednisone. If pain doesn't go away with this let me know.

## 2020-08-03 LAB — CBC WITH DIFFERENTIAL/PLATELET
Basophils Absolute: 0.1 10*3/uL (ref 0.0–0.1)
Basophils Relative: 1.1 % (ref 0.0–3.0)
Eosinophils Absolute: 0.3 10*3/uL (ref 0.0–0.7)
Eosinophils Relative: 4.1 % (ref 0.0–5.0)
HCT: 43.1 % (ref 39.0–52.0)
Hemoglobin: 14.9 g/dL (ref 13.0–17.0)
Lymphocytes Relative: 27.3 % (ref 12.0–46.0)
Lymphs Abs: 1.8 10*3/uL (ref 0.7–4.0)
MCHC: 34.6 g/dL (ref 30.0–36.0)
MCV: 96.6 fl (ref 78.0–100.0)
Monocytes Absolute: 0.6 10*3/uL (ref 0.1–1.0)
Monocytes Relative: 8.4 % (ref 3.0–12.0)
Neutro Abs: 4 10*3/uL (ref 1.4–7.7)
Neutrophils Relative %: 59.1 % (ref 43.0–77.0)
Platelets: 208 10*3/uL (ref 150.0–400.0)
RBC: 4.46 Mil/uL (ref 4.22–5.81)
RDW: 13.1 % (ref 11.5–15.5)
WBC: 6.7 10*3/uL (ref 4.0–10.5)

## 2020-08-03 LAB — COMPREHENSIVE METABOLIC PANEL
ALT: 20 U/L (ref 0–53)
AST: 22 U/L (ref 0–37)
Albumin: 4.6 g/dL (ref 3.5–5.2)
Alkaline Phosphatase: 61 U/L (ref 39–117)
BUN: 18 mg/dL (ref 6–23)
CO2: 27 mEq/L (ref 19–32)
Calcium: 9.8 mg/dL (ref 8.4–10.5)
Chloride: 98 mEq/L (ref 96–112)
Creatinine, Ser: 1.19 mg/dL (ref 0.40–1.50)
GFR: 61.6 mL/min (ref >60.00)
Glucose, Bld: 88 mg/dL (ref 70–99)
Potassium: 4.2 mEq/L (ref 3.5–5.1)
Sodium: 134 mEq/L — ABNORMAL LOW (ref 135–145)
Total Bilirubin: 0.8 mg/dL (ref 0.2–1.2)
Total Protein: 7.2 g/dL (ref 6.0–8.3)

## 2020-08-03 LAB — LIPID PANEL
Cholesterol: 197 mg/dL (ref 0–200)
HDL: 80 mg/dL (ref >39.00)
LDL Cholesterol: 104 mg/dL — ABNORMAL HIGH (ref 0–99)
NonHDL: 117.16
Total CHOL/HDL Ratio: 2
Triglycerides: 64 mg/dL (ref 0.0–149.0)
VLDL: 12.8 mg/dL (ref 0.0–40.0)

## 2020-08-03 LAB — HEMOGLOBIN A1C: Hgb A1c MFr Bld: 5.7 % (ref 4.6–6.5)

## 2020-08-14 MED FILL — METHYLPREDNISOLONE 4 MG TBP: 4 | 6 days supply | Qty: 21 | Fill #0

## 2020-08-18 ENCOUNTER — Encounter: Payer: Self-pay | Admitting: Family Medicine

## 2020-08-18 ENCOUNTER — Telehealth (INDEPENDENT_AMBULATORY_CARE_PROVIDER_SITE_OTHER): Payer: PPO | Admitting: Family Medicine

## 2020-08-18 ENCOUNTER — Other Ambulatory Visit: Payer: Self-pay | Admitting: Family Medicine

## 2020-08-18 VITALS — Temp 96.7°F | Wt 170.0 lb

## 2020-08-18 DIAGNOSIS — J019 Acute sinusitis, unspecified: Secondary | ICD-10-CM

## 2020-08-18 MED ORDER — DOXYCYCLINE HYCLATE 100 MG PO CAPS: 100.0000 mg | ORAL_CAPSULE | Freq: Two times a day (BID) | ORAL | 0 refills | Status: DC

## 2020-08-18 MED FILL — DOXYCYCLINE HYCLATE 100 MG: 100 | 10 days supply | Qty: 20 | Fill #0

## 2020-08-18 NOTE — Progress Notes (Signed)
Subjective:    Patient ID: Russell Cox, male    DOB: [birth year], 70 y.o.   MRN: [id number]  HPI Virtual Visit via Video Note  I connected with the patient on 08/18/20 at  1:45 PM EST by a video enabled telemedicine application and verified that I am speaking with the correct person using two identifiers.  Location patient: home Location provider:work or home office Persons participating in the virtual visit: patient, provider  I discussed the limitations of evaluation and management by telemedicine and the availability of in person appointments. The patient expressed understanding and agreed to proceed.   HPI: Here for 10 days of sinus congestion, PND, blowing yellow mucus from the nose, and a dry cough. No SOB or fever. No body aches or NVD. He tested negative for the Covid virus yesterday. Using Sudafed and Nyquil.    ROS: See pertinent positives and negatives per HPI.  Past Medical History:  Diagnosis Date  . Cataract    removed bilaterally  . Cigarette smoker   . COPD (chronic obstructive pulmonary disease) ([place])   . Hyperlipidemia   . Hypertension   . SINUSITIS, ACUTE MAXILLARY 02/24/2007  . URI 05/28/2007    Past Surgical History:  Procedure Laterality Date  . CATARACT EXTRACTION W/ INTRAOCULAR LENS IMPLANT Bilateral    left  . COLONOSCOPY  03/2016   repeat in 5 years  . ELBOW SURGERY  1988   tennis  . hemmorrhoidectomy  1988  . POLYPECTOMY    . SHOULDER ARTHROSCOPY W/ ACROMIAL REPAIR     left    Family History  Problem Relation Age of Onset  . Cancer Mother        unknown what type  . Colon cancer Mother 70  . Arthritis Mother   . Heart disease Father   . Cancer Father        thyroid  . Hypertension Sister   . Hypertension Brother   . Bladder Cancer Brother 63  . Diabetes Maternal Grandmother   . Cancer Maternal Grandfather        lung  . Colon polyps Neg Hx   . Esophageal cancer Neg Hx   . Rectal cancer Neg Hx   . Stomach cancer Neg Hx       Current Outpatient Medications:  .  aspirin 81 MG tablet, Take 1 tablet (81 mg total) daily by mouth., Disp: 30 tablet, Rfl:  .  atorvastatin (LIPITOR) 40 MG tablet, TAKE 1 TABLET BY MOUTH ONCE DAILY, Disp: 90 tablet, Rfl: 1 .  EPINEPHrine 0.3 mg/0.3 mL IJ SOAJ injection, Inject into the muscle., Disp: , Rfl:  .  FLUAD QUADRIVALENT 0.5 ML injection, , Disp: , Rfl:  .  losartan-hydrochlorothiazide (HYZAAR) 100-25 MG tablet, Take 1 tablet by mouth daily., Disp: 90 tablet, Rfl: 1 .  methylPREDNISolone (MEDROL DOSEPAK) 4 MG TBPK tablet, Take as directed on package, Disp: 21 tablet, Rfl: 0 .  doxycycline (VIBRAMYCIN) 100 MG capsule, Take 1 capsule (100 mg total) by mouth 2 (two) times daily for 10 days., Disp: 20 capsule, Rfl: 0  EXAM:  VITALS per patient if applicable:  GENERAL: alert, oriented, appears well and in no acute distress  HEENT: atraumatic, conjunttiva clear, no obvious abnormalities on inspection of external nose and ears  NECK: normal movements of the head and neck  LUNGS: on inspection no signs of respiratory distress, breathing rate appears normal, no obvious gross SOB, gasping or wheezing  CV: no obvious cyanosis  MS: moves all visible  extremities without noticeable abnormality  PSYCH/NEURO: pleasant and cooperative, no obvious depression or anxiety, speech and thought processing grossly intact  ASSESSMENT AND PLAN: Sinusitis, treat with Doxycycline.  Alysia Penna, MD  Discussed the following assessment and plan:  No diagnosis found.     I discussed the assessment and treatment plan with the patient. The patient was provided an opportunity to ask questions and all were answered. The patient agreed with the plan and demonstrated an understanding of the instructions.   The patient was advised to call back or seek an in-person evaluation if the symptoms worsen or if the condition fails to improve as anticipated.     Review of Systems     Objective:    Physical Exam        Assessment & Plan:

## 2020-08-18 NOTE — Telephone Encounter (Signed)
Spoke with the pt and informed him a visit is needed for evaluation.  Virtual visit scheduled for today with Dr Sarajane Jews at 1:45pm as PCP does not have any openings.

## 2020-09-01 ENCOUNTER — Other Ambulatory Visit (HOSPITAL_BASED_OUTPATIENT_CLINIC_OR_DEPARTMENT_OTHER): Payer: Self-pay

## 2020-09-02 MED FILL — LOSARTAN-HCTZ 100-25 MG TAB: 100-25 | 30 days supply | Qty: 30 | Fill #1

## 2020-10-05 ENCOUNTER — Other Ambulatory Visit (HOSPITAL_COMMUNITY): Payer: Self-pay

## 2020-10-05 MED FILL — Losartan Potassium & Hydrochlorothiazide Tab 100-25 MG: ORAL | 90 days supply | Qty: 90 | Fill #0 | Status: AC

## 2020-11-02 ENCOUNTER — Other Ambulatory Visit (HOSPITAL_COMMUNITY): Payer: Self-pay

## 2020-11-02 MED FILL — Atorvastatin Calcium Tab 40 MG (Base Equivalent): ORAL | 90 days supply | Qty: 90 | Fill #0 | Status: AC

## 2021-01-15 ENCOUNTER — Other Ambulatory Visit (HOSPITAL_COMMUNITY): Payer: Self-pay

## 2021-01-15 ENCOUNTER — Other Ambulatory Visit: Payer: Self-pay | Admitting: Family Medicine

## 2021-01-15 DIAGNOSIS — I10 Essential (primary) hypertension: Secondary | ICD-10-CM

## 2021-01-15 MED ORDER — LOSARTAN POTASSIUM-HCTZ 100-25 MG PO TABS
1.0000 | ORAL_TABLET | Freq: Every day | ORAL | 1 refills | Status: DC
  Filled 2021-01-15: qty 90, 90d supply, fill #0
  Filled 2021-05-07: qty 90, 90d supply, fill #1

## 2021-02-09 ENCOUNTER — Other Ambulatory Visit: Payer: Self-pay | Admitting: Family Medicine

## 2021-02-09 ENCOUNTER — Other Ambulatory Visit (HOSPITAL_COMMUNITY): Payer: Self-pay

## 2021-02-09 DIAGNOSIS — E785 Hyperlipidemia, unspecified: Secondary | ICD-10-CM

## 2021-02-09 MED ORDER — ATORVASTATIN CALCIUM 40 MG PO TABS
ORAL_TABLET | Freq: Every day | ORAL | 0 refills | Status: DC
  Filled 2021-02-09: qty 90, 90d supply, fill #0

## 2021-04-03 ENCOUNTER — Other Ambulatory Visit: Payer: Self-pay | Admitting: Orthopaedic Surgery

## 2021-04-03 DIAGNOSIS — M545 Low back pain, unspecified: Secondary | ICD-10-CM

## 2021-04-17 ENCOUNTER — Other Ambulatory Visit: Payer: Self-pay

## 2021-04-17 DIAGNOSIS — Z87891 Personal history of nicotine dependence: Secondary | ICD-10-CM

## 2021-04-17 DIAGNOSIS — F17210 Nicotine dependence, cigarettes, uncomplicated: Secondary | ICD-10-CM

## 2021-05-07 ENCOUNTER — Other Ambulatory Visit: Payer: Self-pay | Admitting: Family Medicine

## 2021-05-07 ENCOUNTER — Other Ambulatory Visit (HOSPITAL_COMMUNITY): Payer: Self-pay

## 2021-05-07 DIAGNOSIS — E785 Hyperlipidemia, unspecified: Secondary | ICD-10-CM

## 2021-05-08 ENCOUNTER — Other Ambulatory Visit (HOSPITAL_COMMUNITY): Payer: Self-pay

## 2021-05-08 MED ORDER — ATORVASTATIN CALCIUM 40 MG PO TABS
ORAL_TABLET | Freq: Every day | ORAL | 0 refills | Status: DC
  Filled 2021-05-08: qty 90, 90d supply, fill #0

## 2021-05-11 ENCOUNTER — Ambulatory Visit (INDEPENDENT_AMBULATORY_CARE_PROVIDER_SITE_OTHER)
Admission: RE | Admit: 2021-05-11 | Discharge: 2021-05-11 | Disposition: A | Discharge status: Home / Self Care | Payer: PPO | Source: Ambulatory Visit | Attending: Family Medicine | Admitting: Family Medicine

## 2021-05-11 ENCOUNTER — Other Ambulatory Visit: Payer: Self-pay

## 2021-05-11 DIAGNOSIS — F17210 Nicotine dependence, cigarettes, uncomplicated: Secondary | ICD-10-CM

## 2021-05-11 DIAGNOSIS — Z87891 Personal history of nicotine dependence: Secondary | ICD-10-CM

## 2021-05-22 DIAGNOSIS — Z961 Presence of intraocular lens: Secondary | ICD-10-CM | POA: Diagnosis not present

## 2021-05-30 ENCOUNTER — Other Ambulatory Visit: Payer: Self-pay | Admitting: Acute Care

## 2021-05-30 DIAGNOSIS — F17210 Nicotine dependence, cigarettes, uncomplicated: Secondary | ICD-10-CM

## 2021-05-30 DIAGNOSIS — Z87891 Personal history of nicotine dependence: Secondary | ICD-10-CM

## 2021-06-19 ENCOUNTER — Ambulatory Visit: Payer: PPO

## 2021-06-19 ENCOUNTER — Ambulatory Visit (INDEPENDENT_AMBULATORY_CARE_PROVIDER_SITE_OTHER): Payer: PPO

## 2021-06-19 VITALS — Ht 72.0 in | Wt 170.0 lb

## 2021-06-19 DIAGNOSIS — Z0000 Encounter for general adult medical examination without abnormal findings: Secondary | ICD-10-CM | POA: Diagnosis not present

## 2021-06-19 NOTE — Progress Notes (Signed)
I connected with Russell Cox today by telephone and verified that I am speaking with the correct person using two identifiers. Location patient: home Location provider: work Persons participating in the virtual visit: Russell Cox, Russell Durand LPN.   I discussed the limitations, risks, security and privacy concerns of performing an evaluation and management service by telephone and the availability of in person appointments. I also discussed with the patient that there may be a patient responsible charge related to this service. The patient expressed understanding and verbally consented to this telephonic visit.    Interactive audio and video telecommunications were attempted between this provider and patient, however failed, due to patient having technical difficulties OR patient did not have access to video capability.  We continued and completed visit with audio only.     Vital signs may be patient reported or missing.  Subjective:   Russell Cox is a 71 y.o. male who presents for Medicare Annual/Subsequent preventive examination.  Review of Systems     Cardiac Risk Factors include: advanced age (>4men, >87 women);dyslipidemia;hypertension;male gender     Objective:    Today's Vitals   06/19/21 1126  Weight: 170 lb (77.1 kg)  Height: 6' (1.829 m)   Body mass index is 23.06 kg/m.  Advanced Directives 06/19/2021 06/13/2020 03/05/2016  Does Patient Have a Medical Advance Directive? Yes Yes Yes  Type of Paramedic of [place];Living will [place];Living will [place];Living will  Copy of [place] in Chart? No - copy requested No - copy requested -    Current Medications (verified) Outpatient Encounter Medications as of 06/19/2021  Medication Sig   aspirin 81 MG tablet Take 1 tablet (81 mg total) daily by mouth.   atorvastatin (LIPITOR) 40 MG tablet TAKE 1 TABLET BY MOUTH ONCE DAILY   EPINEPHrine 0.3  mg/0.3 mL IJ SOAJ injection Inject into the muscle.   losartan-hydrochlorothiazide (HYZAAR) 100-25 MG tablet TAKE 1 TABLET BY MOUTH DAILY.   doxycycline (VIBRAMYCIN) 100 MG capsule TAKE 1 CAPSULE BY MOUTH 2 TIMES DAILY FOR 10 DAYS (Patient not taking: Reported on 06/19/2021)   FLUAD QUADRIVALENT 0.5 ML injection    methylPREDNISolone (MEDROL DOSEPAK) 4 MG TBPK tablet TAKE ALL 6 TABLETS BY MOUTH ON DAY 1, THEN DECREASE BY ONE TABLET EACH DAY (6-5-4-3-2-1) (Patient not taking: Reported on 06/19/2021)   No facility-administered encounter medications on file as of 06/19/2021.    Allergies (verified) Bee venom and Penicillins   History: Past Medical History:  Diagnosis Date   Cataract    removed bilaterally   Cigarette smoker    COPD (chronic obstructive pulmonary disease) ([place])    Hyperlipidemia    Hypertension    SINUSITIS, ACUTE MAXILLARY 02/24/2007   URI 05/28/2007   Past Surgical History:  Procedure Laterality Date   CATARACT EXTRACTION W/ INTRAOCULAR LENS IMPLANT Bilateral    left   COLONOSCOPY  03/2016   repeat in 5 years   ELBOW SURGERY  1988   tennis   hemmorrhoidectomy  Russell Cox     left   Family History  Problem Relation Age of Onset   Cancer Mother        unknown what type   Colon cancer Mother 44   Arthritis Mother    Heart disease Father    Cancer Father        thyroid   Hypertension Sister  Hypertension Brother    Bladder Cancer Brother 56   Diabetes Maternal Grandmother    Cancer Maternal Grandfather        lung   Colon polyps Neg Hx    Esophageal cancer Neg Hx    Rectal cancer Neg Hx    Stomach cancer Neg Hx    Social History   Socioeconomic History   Marital status: Married    Spouse name: Not on file   Number of children: Not on file   Years of education: Not on file   Highest education level: Not on file  Occupational History   Not on file  Tobacco Use   Smoking status: Every Day     Packs/day: 1.00    Years: 54.00    Pack years: 54.00    Types: Cigarettes    Start date: 34   Smokeless tobacco: Never  Vaping Use   Vaping Use: Never used  Substance and Sexual Activity   Alcohol use: Yes    Comment: socially   Drug use: No   Sexual activity: Not on file  Other Topics Concern   Not on file  Social History Narrative   Not on file   Social Determinants of Health   Financial Resource Strain: Low Risk    Difficulty of Paying Living Expenses: Not hard at all  Food Insecurity: No Food Insecurity   Worried About Charity fundraiser in the Last Year: Never true   [place] in the Last Year: Never true  Transportation Needs: No Transportation Needs   Lack of Transportation (Medical): No   Lack of Transportation (Non-Medical): No  Physical Activity: Insufficiently Active   Days of Exercise per Week: 2 days   Minutes of Exercise per Session: 30 min  Stress: No Stress Concern Present   Feeling of Stress : Not at all  Social Connections: Not on file    Tobacco Counseling Ready to quit: Not Answered Counseling given: Not Answered   Clinical Intake:  Pre-visit preparation completed: Yes  Pain : No/denies pain     Nutritional Status: BMI of 19-24  Normal Nutritional Risks: None Diabetes: No  How often do you need to have someone help you when you read instructions, pamphlets, or other written materials from your doctor or pharmacy?: 1 - Never What is the last grade level you completed in school?: 13yrs college  Diabetic? no  Interpreter Needed?: No  Information entered by :: NAllen LPN   Activities of Daily Living In your present state of health, do you have any difficulty performing the following activities: 06/19/2021  Hearing? N  Vision? N  Difficulty concentrating or making decisions? N  Walking or climbing stairs? N  Dressing or bathing? N  Doing errands, shopping? N  Preparing Food and eating ? N  Using the Toilet? N  In the past  six months, have you accidently leaked urine? N  Do you have problems with loss of bowel control? N  Managing your Medications? N  Managing your Finances? N  Housekeeping or managing your Housekeeping? N  Some recent data might be hidden    Patient Care Team: Caren Macadam, MD as PCP - General (Family Medicine) Lavonna Monarch, MD as Consulting Physician (Dermatology)  Indicate any recent Medical Services you may have received from other than [facility] providers in the past year (date may be approximate).     Assessment:   This is a routine wellness examination for [facility].  Hearing/Vision screen Vision  Screening - Comments:: Regular eye exams, [facility]  Dietary issues and exercise activities discussed: Current Exercise Habits: Home exercise routine, Type of exercise: walking, Time (Minutes): 30, Frequency (Times/Week): 2, Weekly Exercise (Minutes/Week): 60   Goals Addressed             This Visit's Progress    Patient Stated       06/19/2021, stay alive       Depression Screen PHQ 2/9 Scores 06/19/2021 08/02/2020 06/13/2020 03/03/2019 08/05/2014 07/30/2013  PHQ - 2 Score 0 0 0 0 0 0    Fall Risk Fall Risk  06/19/2021 08/02/2020 06/13/2020 03/03/2019 08/05/2014  Falls in the past year? 0 0 0 0 No  Number falls in past yr: - 0 0 0 -  Injury with Fall? - 0 0 0 -  Risk for fall due to : Medication side effect - Impaired vision - -  Follow up Falls evaluation completed;Education provided;Falls prevention discussed - Falls prevention discussed - -    FALL RISK PREVENTION PERTAINING TO THE HOME:  Any stairs in or around the home? Yes  If so, are there any without handrails? No  Home free of loose throw rugs in walkways, pet beds, electrical cords, etc? Yes  Adequate lighting in your home to reduce risk of falls? Yes   ASSISTIVE DEVICES UTILIZED TO PREVENT FALLS:  Life alert? No  Use of a cane, walker or w/c? No  Grab bars in the bathroom? No  Shower chair or bench in  shower? Yes  Elevated toilet seat or a handicapped toilet? No   TIMED UP AND GO:  Was the test performed? No .      Cognitive Function:     6CIT Screen 06/19/2021 06/13/2020  What Year? 0 points 0 points  What month? 0 points 0 points  What time? 0 points -  Count back from 20 0 points 0 points  Months in reverse 0 points 0 points  Repeat phrase 0 points 0 points  Total Score 0 -    Immunizations Immunization History  Administered Date(s) Administered   Fluad Quad(high Dose 65+) 03/03/2019, 03/15/2020   Influenza Split 03/25/2011, 04/20/2012   Influenza Whole 05/28/2007   Influenza, High Dose Seasonal PF 04/15/2017   Influenza,inj,Quad PF,6+ Mos 04/06/2013   Influenza-Unspecified 03/10/2014   PFIZER(Purple Top)SARS-COV-2 Vaccination 07/23/2019, 08/14/2019, 04/11/2020   Pneumococcal Conjugate-13 08/05/2014   Pneumococcal Polysaccharide-23 04/15/2017   Tdap 03/25/2011   Zoster Recombinat (Shingrix) 03/17/2018, 07/27/2018, 12/08/2018    TDAP status: Up to date  Flu Vaccine status: Up to date  Pneumococcal vaccine status: Up to date  Covid-19 vaccine status: Completed vaccines  Qualifies for Shingles Vaccine? Yes   Zostavax completed Yes   Shingrix Completed?: Yes  Screening Tests Health Maintenance  Topic Date Due   COVID-19 Vaccine (4 - Booster for Pfizer series) 06/06/2020   INFLUENZA VACCINE  01/08/2021   COLONOSCOPY (Pts 45-71yrs Insurance coverage will need to be confirmed)  04/04/2022   TETANUS/TDAP  01/06/2030   Pneumonia Vaccine 44+ Years old  Completed   Hepatitis C Screening  Completed   Zoster Vaccines- Shingrix  Completed   HPV VACCINES  Aged Out    Health Maintenance  Health Maintenance Due  Topic Date Due   COVID-19 Vaccine (4 - Booster for Pfizer series) 06/06/2020   INFLUENZA VACCINE  01/08/2021    Colorectal cancer screening: Type of screening: Colonoscopy. Completed 04/05/2019. Repeat every 3 years  Lung Cancer Screening: (Low Dose  CT Chest  recommended if Age 25-80 years, 88 pack-year currently smoking OR have quit w/in 15years.) does qualify.   Lung Cancer Screening Referral: CT scan 05/13/2021  Additional Screening:  Hepatitis C Screening: does qualify; Completed 04/15/2017  Vision Screening: Recommended annual ophthalmology exams for early detection of glaucoma and other disorders of the eye. Is the patient up to date with their annual eye exam?  Yes  Who is the provider or what is the name of the office in which the patient attends annual eye exams? [facility] If pt is not established with a provider, would they like to be referred to a provider to establish care? No .   Dental Screening: Recommended annual dental exams for proper oral hygiene  Community Resource Referral / Chronic Care Management: CRR required this visit?  No   CCM required this visit?  No      Plan:     I have personally reviewed and noted the following in the patients chart:   Medical and social history Use of alcohol, tobacco or illicit drugs  Current medications and supplements including opioid prescriptions. Patient is not currently taking opioid prescriptions. Functional ability and status Nutritional status Physical activity Advanced directives List of other physicians Hospitalizations, surgeries, and ER visits in previous 12 months Vitals Screenings to include cognitive, depression, and falls Referrals and appointments  In addition, I have reviewed and discussed with patient certain preventive protocols, quality metrics, and best practice recommendations. A written personalized care plan for preventive services as well as general preventive health recommendations were provided to patient.     Kellie Simmering, LPN   [id number]/47/[id number]   Nurse Notes: none

## 2021-06-19 NOTE — Patient Instructions (Signed)
Russell Cox , Thank you for taking time to come for your Medicare Wellness Visit. I appreciate your ongoing commitment to your health goals. Please review the following plan we discussed and let me know if I can assist you in the future.   Screening recommendations/referrals: Colonoscopy: completed 04/05/2019 Recommended yearly ophthalmology/optometry visit for glaucoma screening and checkup Recommended yearly dental visit for hygiene and checkup  Vaccinations: Influenza vaccine: completed 04/04/2021 Pneumococcal vaccine: completed 04/15/2017 Tdap vaccine: completed 01/07/2020, due 01/06/2030 Shingles vaccine: completed   Covid-19:  04/04/2021, 04/11/2020, 08/14/2019, 07/23/2019  Advanced directives: Please bring a copy of your POA (Power of Attorney) and/or Living Will to your next appointment.   Conditions/risks identified: none  Next appointment: Follow up in one year for your annual wellness visit.   Preventive Care 73 Years and Older, Male Preventive care refers to lifestyle choices and visits with your health care provider that can promote health and wellness. What does preventive care include? A yearly physical exam. This is also called an annual well check. Dental exams once or twice a year. Routine eye exams. Ask your health care provider how often you should have your eyes checked. Personal lifestyle choices, including: Daily care of your teeth and gums. Regular physical activity. Eating a healthy diet. Avoiding tobacco and drug use. Limiting alcohol use. Practicing safe sex. Taking low doses of aspirin every day. Taking vitamin and mineral supplements as recommended by your health care provider. What happens during an annual well check? The services and screenings done by your health care provider during your annual well check will depend on your age, overall health, lifestyle risk factors, and family history of disease. Counseling  Your health care provider may ask you  questions about your: Alcohol use. Tobacco use. Drug use. Emotional well-being. Home and relationship well-being. Sexual activity. Eating habits. History of falls. Memory and ability to understand (cognition). Work and work Statistician. Screening  You may have the following tests or measurements: Height, weight, and BMI. Blood pressure. Lipid and cholesterol levels. These may be checked every 5 years, or more frequently if you are over 63 years old. Skin check. Lung cancer screening. You may have this screening every year starting at age 7 if you have a 30-pack-year history of smoking and currently smoke or have quit within the past 15 years. Fecal occult blood test (FOBT) of the stool. You may have this test every year starting at age 28. Flexible sigmoidoscopy or colonoscopy. You may have a sigmoidoscopy every 5 years or a colonoscopy every 10 years starting at age 47. Prostate cancer screening. Recommendations will vary depending on your family history and other risks. Hepatitis C blood test. Hepatitis B blood test. Sexually transmitted disease (STD) testing. Diabetes screening. This is done by checking your blood sugar (glucose) after you have not eaten for a while (fasting). You may have this done every 1-3 years. Abdominal aortic aneurysm (AAA) screening. You may need this if you are a current or former smoker. Osteoporosis. You may be screened starting at age 33 if you are at high risk. Talk with your health care provider about your test results, treatment options, and if necessary, the need for more tests. Vaccines  Your health care provider may recommend certain vaccines, such as: Influenza vaccine. This is recommended every year. Tetanus, diphtheria, and acellular pertussis (Tdap, Td) vaccine. You may need a Td booster every 10 years. Zoster vaccine. You may need this after age 40. Pneumococcal 13-valent conjugate (PCV13) vaccine. One dose is  recommended after age  45. Pneumococcal polysaccharide (PPSV23) vaccine. One dose is recommended after age 13. Talk to your health care provider about which screenings and vaccines you need and how often you need them. This information is not intended to replace advice given to you by your health care provider. Make sure you discuss any questions you have with your health care provider. Document Released: 06/23/2015 Document Revised: 02/14/2016 Document Reviewed: 03/28/2015 Elsevier Interactive Patient Education  2017 [employer] Prevention in the Home Falls can cause injuries. They can happen to people of all ages. There are many things you can do to make your home safe and to help prevent falls. What can I do on the outside of my home? Regularly fix the edges of walkways and driveways and fix any cracks. Remove anything that might make you trip as you walk through a door, such as a raised step or threshold. Trim any bushes or trees on the path to your home. Use bright outdoor lighting. Clear any walking paths of anything that might make someone trip, such as rocks or tools. Regularly check to see if handrails are loose or broken. Make sure that both sides of any steps have handrails. Any raised decks and porches should have guardrails on the edges. Have any leaves, snow, or ice cleared regularly. Use sand or salt on walking paths during winter. Clean up any spills in your garage right away. This includes oil or grease spills. What can I do in the bathroom? Use night lights. Install grab bars by the toilet and in the tub and shower. Do not use towel bars as grab bars. Use non-skid mats or decals in the tub or shower. If you need to sit down in the shower, use a plastic, non-slip stool. Keep the floor dry. Clean up any water that spills on the floor as soon as it happens. Remove soap buildup in the tub or shower regularly. Attach bath mats securely with double-sided non-slip rug tape. Do not have throw  rugs and other things on the floor that can make you trip. What can I do in the bedroom? Use night lights. Make sure that you have a light by your bed that is easy to reach. Do not use any sheets or blankets that are too big for your bed. They should not hang down onto the floor. Have a firm chair that has side arms. You can use this for support while you get dressed. Do not have throw rugs and other things on the floor that can make you trip. What can I do in the kitchen? Clean up any spills right away. Avoid walking on wet floors. Keep items that you use a lot in easy-to-reach places. If you need to reach something above you, use a strong step stool that has a grab bar. Keep electrical cords out of the way. Do not use floor polish or wax that makes floors slippery. If you must use wax, use non-skid floor wax. Do not have throw rugs and other things on the floor that can make you trip. What can I do with my stairs? Do not leave any items on the stairs. Make sure that there are handrails on both sides of the stairs and use them. Fix handrails that are broken or loose. Make sure that handrails are as long as the stairways. Check any carpeting to make sure that it is firmly attached to the stairs. Fix any carpet that is loose or worn. Avoid having  throw rugs at the top or bottom of the stairs. If you do have throw rugs, attach them to the floor with carpet tape. Make sure that you have a light switch at the top of the stairs and the bottom of the stairs. If you do not have them, ask someone to add them for you. What else can I do to help prevent falls? Wear shoes that: Do not have high heels. Have rubber bottoms. Are comfortable and fit you well. Are closed at the toe. Do not wear sandals. If you use a stepladder: Make sure that it is fully opened. Do not climb a closed stepladder. Make sure that both sides of the stepladder are locked into place. Ask someone to hold it for you, if  possible. Clearly mark and make sure that you can see: Any grab bars or handrails. First and last steps. Where the edge of each step is. Use tools that help you move around (mobility aids) if they are needed. These include: Canes. Walkers. Scooters. Crutches. Turn on the lights when you go into a dark area. Replace any light bulbs as soon as they burn out. Set up your furniture so you have a clear path. Avoid moving your furniture around. If any of your floors are uneven, fix them. If there are any pets around you, be aware of where they are. Review your medicines with your doctor. Some medicines can make you feel dizzy. This can increase your chance of falling. Ask your doctor what other things that you can do to help prevent falls. This information is not intended to replace advice given to you by your health care provider. Make sure you discuss any questions you have with your health care provider. Document Released: 03/23/2009 Document Revised: 11/02/2015 Document Reviewed: 07/01/2014 Elsevier Interactive Patient Education  2017 [employer].

## 2021-07-10 ENCOUNTER — Ambulatory Visit: Payer: PPO | Admitting: Dermatology

## 2021-07-10 ENCOUNTER — Encounter: Payer: Self-pay | Admitting: Dermatology

## 2021-07-10 ENCOUNTER — Other Ambulatory Visit: Payer: Self-pay

## 2021-07-10 DIAGNOSIS — L720 Epidermal cyst: Secondary | ICD-10-CM

## 2021-07-10 DIAGNOSIS — D485 Neoplasm of uncertain behavior of skin: Secondary | ICD-10-CM

## 2021-07-10 DIAGNOSIS — D0439 Carcinoma in situ of skin of other parts of face: Secondary | ICD-10-CM

## 2021-07-10 DIAGNOSIS — L570 Actinic keratosis: Secondary | ICD-10-CM | POA: Diagnosis not present

## 2021-07-10 DIAGNOSIS — Z1283 Encounter for screening for malignant neoplasm of skin: Secondary | ICD-10-CM | POA: Diagnosis not present

## 2021-07-10 DIAGNOSIS — D692 Other nonthrombocytopenic purpura: Secondary | ICD-10-CM | POA: Diagnosis not present

## 2021-07-10 NOTE — Patient Instructions (Signed)

## 2021-08-03 ENCOUNTER — Encounter: Payer: Self-pay | Admitting: Dermatology

## 2021-08-03 NOTE — Progress Notes (Signed)
Follow-Up Visit   Subjective  Russell Cox is a 71 y.o. male who presents for the following: Annual Exam (Pt here for full body. Pt has no other concerns ).  General skin examination, several new crusts Location:  Duration:  Quality:  Associated Signs/Symptoms: Modifying Factors:  Severity:  Timing: Context:   Objective  Well appearing patient in no apparent distress; mood and affect are within normal limits. Upper Body General skin examination, no atypical pigmented lesions.  1 possible new nonmelanoma skin cancer left medial brow will be biopsied and treated.  Torso - Posterior (Back) scattered 0.8 to 2 cm noninflamed open and closed epidermoid cysts.  Right Wrist - Anterior 1 cm ecchymoses, denies other areas of easy bleeding  Left Forearm - Posterior, Right Nasal Sidewall Hornlike 4 mm pink crusts  Left Inner Eyebrow  Left Inner Eyebrow 5 mm waxy hornlike crust         A full examination was performed including scalp, head, eyes, ears, nose, lips, neck, chest, axillae, abdomen, back, buttocks, bilateral upper extremities, bilateral lower extremities, hands, feet, fingers, toes, fingernails, and toenails. All findings within normal limits unless otherwise noted below.   Assessment & Plan    Screening exam for skin cancer Upper Body  Discussed smoking cessation.  Annual skin examination.  Epidermoid cyst Torso - Posterior (Back)  Patient will decide if he desires removal of any of these lesions.  Risks discussed included recurrence, unsightly scar, noncoverage by health insurance.  Solar purpura ([place]) Right Wrist - Anterior  May try over-the-counter Dermend  Actinic keratosis (2) Left Forearm - Posterior; Right Nasal Sidewall  Destruction of lesion - Left Forearm - Posterior, Right Nasal Sidewall Complexity: simple   Destruction method: cryotherapy   Informed consent: discussed and consent obtained   Timeout:  patient name, date of birth,  surgical site, and procedure verified Lesion destroyed using liquid nitrogen: Yes   Cryotherapy cycles:  3 Outcome: patient tolerated procedure well with no complications   Post-procedure details: wound care instructions given    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  After shave biopsy the base of the lesion was treated with curettage plus cautery  Skin / nail biopsy - Left Inner Eyebrow Type of biopsy: tangential   Informed consent: discussed and consent obtained   Timeout: patient name, date of birth, surgical site, and procedure verified   Anesthesia: the lesion was anesthetized in a standard fashion   Anesthetic:  1% lidocaine w/ epinephrine 1-100,000 local infiltration Instrument used: flexible razor blade   Hemostasis achieved with: ferric subsulfate and electrodesiccation   Outcome: patient tolerated procedure well   Post-procedure details: wound care instructions given    Destruction of lesion - Left Inner Eyebrow Complexity: simple   Destruction method: electrodesiccation and curettage   Informed consent: discussed and consent obtained   Timeout:  patient name, date of birth, surgical site, and procedure verified Anesthesia: the lesion was anesthetized in a standard fashion   Anesthetic:  1% lidocaine w/ epinephrine 1-100,000 local infiltration Curettage performed in three different directions: Yes   Electrodesiccation performed over the curetted area: Yes   Curettage cycles:  3 Lesion length (cm):  0.5 Lesion width (cm):  0.5 Margin per side (cm):  0 Final wound size (cm):  0.5 Hemostasis achieved with:  ferric subsulfate Outcome: patient tolerated procedure well with no complications   Post-procedure details: wound care instructions given   Additional details:  TXPBX  Specimen 1 - Surgical  pathology Differential Diagnosis: R/O BCC VS SCC - TXPBX  Check Margins: No      I, Lavonna Monarch, MD, have reviewed all documentation for this  visit.  The documentation on 08/03/21 for the exam, diagnosis, procedures, and orders are all accurate and complete.

## 2021-08-06 ENCOUNTER — Ambulatory Visit (INDEPENDENT_AMBULATORY_CARE_PROVIDER_SITE_OTHER): Payer: PPO | Admitting: Family Medicine

## 2021-08-06 ENCOUNTER — Encounter: Payer: Self-pay | Admitting: Family Medicine

## 2021-08-06 VITALS — BP 128/82 | HR 82 | Temp 97.5°F | Ht 71.25 in | Wt 163.8 lb

## 2021-08-06 DIAGNOSIS — Z23 Encounter for immunization: Secondary | ICD-10-CM

## 2021-08-06 DIAGNOSIS — Z125 Encounter for screening for malignant neoplasm of prostate: Secondary | ICD-10-CM | POA: Diagnosis not present

## 2021-08-06 DIAGNOSIS — Z0000 Encounter for general adult medical examination without abnormal findings: Secondary | ICD-10-CM

## 2021-08-06 DIAGNOSIS — I10 Essential (primary) hypertension: Secondary | ICD-10-CM | POA: Diagnosis not present

## 2021-08-06 DIAGNOSIS — E785 Hyperlipidemia, unspecified: Secondary | ICD-10-CM

## 2021-08-06 DIAGNOSIS — R7302 Impaired glucose tolerance (oral): Secondary | ICD-10-CM | POA: Diagnosis not present

## 2021-08-06 DIAGNOSIS — Z720 Tobacco use: Secondary | ICD-10-CM | POA: Diagnosis not present

## 2021-08-06 LAB — CBC WITH DIFFERENTIAL/PLATELET
Basophils Absolute: 0.1 10*3/uL (ref 0.0–0.1)
Basophils Relative: 0.9 % (ref 0.0–3.0)
Eosinophils Absolute: 0.3 10*3/uL (ref 0.0–0.7)
Eosinophils Relative: 4.7 % (ref 0.0–5.0)
HCT: 48.3 % (ref 39.0–52.0)
Hemoglobin: 15.9 g/dL (ref 13.0–17.0)
Lymphocytes Relative: 23.4 % (ref 12.0–46.0)
Lymphs Abs: 1.4 10*3/uL (ref 0.7–4.0)
MCHC: 33 g/dL (ref 30.0–36.0)
MCV: 97.7 fl (ref 78.0–100.0)
Monocytes Absolute: 0.6 10*3/uL (ref 0.1–1.0)
Monocytes Relative: 9.7 % (ref 3.0–12.0)
Neutro Abs: 3.7 10*3/uL (ref 1.4–7.7)
Neutrophils Relative %: 61.3 % (ref 43.0–77.0)
Platelets: 192 10*3/uL (ref 150.0–400.0)
RBC: 4.94 Mil/uL (ref 4.22–5.81)
RDW: 13.5 % (ref 11.5–15.5)
WBC: 6.1 10*3/uL (ref 4.0–10.5)

## 2021-08-06 LAB — COMPREHENSIVE METABOLIC PANEL
ALT: 22 U/L (ref 0–53)
AST: 23 U/L (ref 0–37)
Albumin: 4.8 g/dL (ref 3.5–5.2)
Alkaline Phosphatase: 66 U/L (ref 39–117)
BUN: 20 mg/dL (ref 6–23)
CO2: 29 mEq/L (ref 19–32)
Calcium: 10.6 mg/dL — ABNORMAL HIGH (ref 8.4–10.5)
Chloride: 100 mEq/L (ref 96–112)
Creatinine, Ser: 1.37 mg/dL (ref 0.40–1.50)
GFR: 51.65 mL/min — ABNORMAL LOW (ref >60.00)
Glucose, Bld: 113 mg/dL — ABNORMAL HIGH (ref 70–99)
Potassium: 4.5 mEq/L (ref 3.5–5.1)
Sodium: 135 mEq/L (ref 135–145)
Total Bilirubin: 0.7 mg/dL (ref 0.2–1.2)
Total Protein: 8.2 g/dL (ref 6.0–8.3)

## 2021-08-06 LAB — LIPID PANEL
Cholesterol: 199 mg/dL (ref 0–200)
HDL: 69.8 mg/dL (ref >39.00)
LDL Cholesterol: 98 mg/dL (ref 0–99)
NonHDL: 129.39
Total CHOL/HDL Ratio: 3
Triglycerides: 158 mg/dL — ABNORMAL HIGH (ref 0.0–149.0)
VLDL: 31.6 mg/dL (ref 0.0–40.0)

## 2021-08-06 LAB — HEMOGLOBIN A1C: Hgb A1c MFr Bld: 5.9 % (ref 4.6–6.5)

## 2021-08-06 NOTE — Progress Notes (Signed)
Russell Cox DOB: 1950/10/23 Encounter date: 08/06/2021  This is a 71 y.o. male who presents for complete physical   History of present illness/Additional concerns:  HTN: hyzaar 100-25mg  daily HL: Lipitor 40mg daily  Repeat colonoscopy due 03/2022 CT chest lung cancer screening last done 05/11/21; repeats in June due to nodules.  Just sent away for "quit now" packet. Hasn't quit fully in past. Tried all meds out there.   Feels like he is eating same. States that he gets some weight fluctuations.   Follows regularly with dermatology, Dr. Denna Haggard.   Gets up 2-3 times/night to urinate. Easily falls back asleep.    Past Medical History:  Diagnosis Date   Atypical mole 03/26/2010   R Mid Lower Back - WS   Basal cell carcinoma 03/10/2006   L Side Nose - MOHs   Basal cell carcinoma (BCC) 03/26/2010   L Inferior Nose - MOHs   Cataract    removed bilaterally   Cigarette smoker    COPD (chronic obstructive pulmonary disease) ([place])    Hyperlipidemia    Hypertension    SINUSITIS, ACUTE MAXILLARY 02/24/2007   URI 05/28/2007   Past Surgical History:  Procedure Laterality Date   CATARACT EXTRACTION W/ INTRAOCULAR LENS IMPLANT Bilateral    left   COLONOSCOPY  03/2016   repeat in 5 years   ELBOW SURGERY  1988   tennis   hemmorrhoidectomy  1988   POLYPECTOMY     SHOULDER ARTHROSCOPY W/ ACROMIAL REPAIR     left   Allergies  Allergen Reactions   Bee Venom Anaphylaxis   Penicillins Hives   Current Meds  Medication Sig   aspirin 81 MG tablet Take 1 tablet (81 mg total) daily by mouth.   atorvastatin (LIPITOR) 40 MG tablet TAKE 1 TABLET BY MOUTH ONCE DAILY   EPINEPHrine 0.3 mg/0.3 mL IJ SOAJ injection Inject into the muscle.   FLUAD QUADRIVALENT 0.5 ML injection    FLUZONE HIGH-DOSE QUADRIVALENT 0.7 ML SUSY    losartan-hydrochlorothiazide (HYZAAR) 100-25 MG tablet TAKE 1 TABLET BY MOUTH DAILY.   PFIZER COVID-19 VAC BIVALENT injection    Social History   Tobacco Use   Smoking  status: Every Day    Packs/day: 1.00    Years: 54.00    Pack years: 54.00    Types: Cigarettes    Start date: 1975   Smokeless tobacco: Never   Tobacco comments:    DISCUSSED SMOKING CESSATION 07/10/2021  Substance Use Topics   Alcohol use: Yes    Comment: socially   Family History  Problem Relation Age of Onset   Cancer Mother        unknown what type   Colon cancer Mother 17   Arthritis Mother    Heart disease Father    Cancer Father        thyroid   Hypertension Sister    Hypertension Brother    Bladder Cancer Brother 65   Diabetes Maternal Grandmother    Cancer Maternal Grandfather        lung   Colon polyps Neg Hx    Esophageal cancer Neg Hx    Rectal cancer Neg Hx    Stomach cancer Neg Hx      Review of Systems  Constitutional:  Negative for activity change, appetite change, chills, fatigue, fever and unexpected weight change.  HENT:  Negative for congestion, ear pain, hearing loss, sinus pressure, sinus pain, sore throat and trouble swallowing.   Eyes:  Negative for pain and  visual disturbance.  Respiratory:  Positive for cough (related to smoking). Negative for chest tightness, shortness of breath and wheezing.   Cardiovascular:  Negative for chest pain, palpitations and leg swelling.  Gastrointestinal:  Negative for abdominal distention, abdominal pain, blood in stool, constipation, diarrhea, nausea and vomiting.  Genitourinary:  Negative for decreased urine volume, difficulty urinating, dysuria, penile pain and testicular pain.  Musculoskeletal:  Negative for arthralgias, back pain and joint swelling.  Skin:  Negative for rash.  Neurological:  Negative for dizziness, weakness, numbness and headaches.  Hematological:  Negative for adenopathy. Does not bruise/bleed easily.  Psychiatric/Behavioral:  Negative for agitation, sleep disturbance and suicidal ideas. The patient is not nervous/anxious.    CBC:  Lab Results  Component Value Date   WBC 6.1 08/06/2021    HGB 15.9 08/06/2021   HCT 48.3 08/06/2021   MCHC 33.0 08/06/2021   RDW 13.5 08/06/2021   PLT 192.0 08/06/2021   CMP: Lab Results  Component Value Date   NA 135 08/06/2021   K 4.5 08/06/2021   CL 100 08/06/2021   CO2 29 08/06/2021   GLUCOSE 113 (H) 08/06/2021   BUN 20 08/06/2021   CREATININE 1.37 08/06/2021   CREATININE 1.25 (H) 03/06/2020   GFRAA 111 12/21/2007   CALCIUM 10.6 (H) 08/06/2021   PROT 8.2 08/06/2021   BILITOT 0.7 08/06/2021   ALKPHOS 66 08/06/2021   ALT 22 08/06/2021   AST 23 08/06/2021   LIPID: Lab Results  Component Value Date   CHOL 199 08/06/2021   TRIG 158.0 (H) 08/06/2021   HDL 69.80 08/06/2021   LDLCALC 98 08/06/2021    Objective:  BP 128/82 (BP Location: Left Arm, Patient Position: Sitting, Cuff Size: Normal)    Pulse 82    Temp (!) 97.5 F (36.4 C) (Oral)    Ht 5' 11.25" (1.81 m)    Wt 163 lb 12.8 oz (74.3 kg)    SpO2 96%    BMI 22.69 kg/m   Weight: 163 lb 12.8 oz (74.3 kg)   BP Readings from Last 3 Encounters:  08/06/21 128/82  08/02/20 (!) 150/80  12/08/19 (!) 120/58   Wt Readings from Last 3 Encounters:  08/06/21 163 lb 12.8 oz (74.3 kg)  06/19/21 170 lb (77.1 kg)  08/18/20 170 lb (77.1 kg)    Physical Exam Constitutional:      General: He is not in acute distress.    Appearance: He is well-developed.  HENT:     Head: Normocephalic and atraumatic.     Right Ear: External ear normal.     Left Ear: External ear normal.     Nose: Nose normal.     Mouth/Throat:     Pharynx: No oropharyngeal exudate.  Eyes:     Conjunctiva/sclera: Conjunctivae normal.     Pupils: Pupils are equal, round, and reactive to light.  Neck:     Thyroid: No thyromegaly.  Cardiovascular:     Rate and Rhythm: Normal rate and regular rhythm.     Heart sounds: Normal heart sounds. No murmur heard.   No friction rub. No gallop.  Pulmonary:     Effort: Pulmonary effort is normal. No respiratory distress.     Breath sounds: Normal breath sounds. No  stridor. No wheezing or rales.  Abdominal:     General: Bowel sounds are normal.     Palpations: Abdomen is soft.  Musculoskeletal:        General: Normal range of motion.     Cervical back:  Neck supple.  Skin:    General: Skin is warm and dry.  Neurological:     Mental Status: He is alert and oriented to person, place, and time.  Psychiatric:        Behavior: Behavior normal.        Thought Content: Thought content normal.        Judgment: Judgment normal.    Assessment/Plan: There are no preventive care reminders to display for this patient. Health Maintenance reviewed - will get prevnar 20 today. Colonoscopy due in fall.   1. Preventative health care Encouraged regular exercise.   2. Primary hypertension Continue with losartan-hctz.  - CBC with Differential/Platelet; Future - Comprehensive metabolic panel; Future  3. Dyslipidemia Continue with lipitor 40mg .  - Lipid panel; Future  4. Tobacco abuse He is motivated to stop.   5. Impaired glucose tolerance - Hemoglobin A1c; Future   Return in about 6 months (around 02/03/2022) for Chronic condition visit.  [name], MD

## 2021-08-14 ENCOUNTER — Other Ambulatory Visit (HOSPITAL_COMMUNITY): Payer: Self-pay

## 2021-08-14 ENCOUNTER — Other Ambulatory Visit: Payer: Self-pay | Admitting: Family Medicine

## 2021-08-14 DIAGNOSIS — I10 Essential (primary) hypertension: Secondary | ICD-10-CM

## 2021-08-14 DIAGNOSIS — E785 Hyperlipidemia, unspecified: Secondary | ICD-10-CM

## 2021-08-14 MED ORDER — ATORVASTATIN CALCIUM 40 MG PO TABS
ORAL_TABLET | Freq: Every day | ORAL | 1 refills | Status: DC
  Filled 2021-08-14: qty 90, 90d supply, fill #0
  Filled 2021-11-13: qty 90, 90d supply, fill #1

## 2021-08-14 MED ORDER — LOSARTAN POTASSIUM-HCTZ 100-25 MG PO TABS
1.0000 | ORAL_TABLET | Freq: Every day | ORAL | 1 refills | Status: DC
  Filled 2021-08-14: qty 90, 90d supply, fill #0
  Filled 2021-11-13: qty 90, 90d supply, fill #1

## 2021-10-25 ENCOUNTER — Encounter: Payer: Self-pay | Admitting: Acute Care

## 2021-11-13 ENCOUNTER — Other Ambulatory Visit (HOSPITAL_COMMUNITY): Payer: Self-pay

## 2021-11-14 ENCOUNTER — Other Ambulatory Visit: Payer: Self-pay | Admitting: Acute Care

## 2021-11-14 ENCOUNTER — Ambulatory Visit (INDEPENDENT_AMBULATORY_CARE_PROVIDER_SITE_OTHER)
Admission: RE | Admit: 2021-11-14 | Discharge: 2021-11-14 | Disposition: A | Discharge status: Home / Self Care | Payer: PPO | Source: Ambulatory Visit | Attending: Acute Care | Admitting: Acute Care

## 2021-11-14 DIAGNOSIS — F17210 Nicotine dependence, cigarettes, uncomplicated: Secondary | ICD-10-CM

## 2021-11-14 DIAGNOSIS — I700 Atherosclerosis of aorta: Secondary | ICD-10-CM | POA: Diagnosis not present

## 2021-11-14 DIAGNOSIS — R590 Localized enlarged lymph nodes: Secondary | ICD-10-CM | POA: Diagnosis not present

## 2021-11-14 DIAGNOSIS — J432 Centrilobular emphysema: Secondary | ICD-10-CM | POA: Diagnosis not present

## 2021-11-14 DIAGNOSIS — Z87891 Personal history of nicotine dependence: Secondary | ICD-10-CM

## 2021-11-14 DIAGNOSIS — R911 Solitary pulmonary nodule: Secondary | ICD-10-CM | POA: Diagnosis not present

## 2021-11-16 ENCOUNTER — Other Ambulatory Visit: Payer: Self-pay | Admitting: Acute Care

## 2021-11-16 DIAGNOSIS — F17210 Nicotine dependence, cigarettes, uncomplicated: Secondary | ICD-10-CM

## 2021-11-16 DIAGNOSIS — Z87891 Personal history of nicotine dependence: Secondary | ICD-10-CM

## 2021-11-16 DIAGNOSIS — Z122 Encounter for screening for malignant neoplasm of respiratory organs: Secondary | ICD-10-CM

## 2021-12-03 ENCOUNTER — Other Ambulatory Visit: Payer: Self-pay | Admitting: Orthopaedic Surgery

## 2021-12-03 MED ORDER — METHYLPREDNISOLONE 4 MG PO TBPK
ORAL_TABLET | ORAL | 0 refills | Status: DC
  Filled 2021-12-03: qty 21, 6d supply, fill #0

## 2021-12-04 ENCOUNTER — Other Ambulatory Visit (HOSPITAL_COMMUNITY): Payer: Self-pay

## 2022-01-21 ENCOUNTER — Other Ambulatory Visit (HOSPITAL_COMMUNITY): Payer: Self-pay

## 2022-01-21 DIAGNOSIS — H1045 Other chronic allergic conjunctivitis: Secondary | ICD-10-CM | POA: Diagnosis not present

## 2022-01-21 MED ORDER — LOTEPREDNOL ETABONATE 0.5 % OP GEL
OPHTHALMIC | 0 refills | Status: DC
  Filled 2022-01-21: qty 5, 15d supply, fill #0

## 2022-01-25 DIAGNOSIS — H1045 Other chronic allergic conjunctivitis: Secondary | ICD-10-CM | POA: Diagnosis not present

## 2022-02-19 ENCOUNTER — Other Ambulatory Visit (HOSPITAL_COMMUNITY): Payer: Self-pay

## 2022-02-19 ENCOUNTER — Other Ambulatory Visit: Payer: Self-pay

## 2022-02-20 ENCOUNTER — Other Ambulatory Visit (HOSPITAL_COMMUNITY): Payer: Self-pay

## 2022-02-21 ENCOUNTER — Other Ambulatory Visit: Payer: Self-pay | Admitting: Family Medicine

## 2022-02-21 ENCOUNTER — Telehealth (INDEPENDENT_AMBULATORY_CARE_PROVIDER_SITE_OTHER): Payer: PPO | Admitting: Family Medicine

## 2022-02-21 ENCOUNTER — Encounter: Payer: Self-pay | Admitting: Family Medicine

## 2022-02-21 ENCOUNTER — Other Ambulatory Visit (HOSPITAL_COMMUNITY): Payer: Self-pay

## 2022-02-21 DIAGNOSIS — I10 Essential (primary) hypertension: Secondary | ICD-10-CM | POA: Diagnosis not present

## 2022-02-21 DIAGNOSIS — E785 Hyperlipidemia, unspecified: Secondary | ICD-10-CM | POA: Diagnosis not present

## 2022-02-21 DIAGNOSIS — U071 COVID-19: Secondary | ICD-10-CM

## 2022-02-21 MED ORDER — NIRMATRELVIR/RITONAVIR (PAXLOVID)TABLET: 3.0000 | ORAL_TABLET | Freq: Two times a day (BID) | ORAL | 0 refills | Status: AC

## 2022-02-21 MED ORDER — LOSARTAN POTASSIUM-HCTZ 100-25 MG PO TABS
1.0000 | ORAL_TABLET | Freq: Every day | ORAL | 1 refills | Status: DC
  Filled 2022-02-21: qty 90, 90d supply, fill #0
  Filled 2022-05-27: qty 90, 90d supply, fill #1

## 2022-02-21 MED ORDER — ATORVASTATIN CALCIUM 40 MG PO TABS
ORAL_TABLET | Freq: Every day | ORAL | 1 refills | Status: DC
  Filled 2022-02-21: qty 90, 90d supply, fill #0
  Filled 2022-05-27: qty 90, 90d supply, fill #1

## 2022-02-21 NOTE — Progress Notes (Signed)
   Established Patient Office Visit  Subjective   Patient ID: Russell Cox, male    DOB: 09-01-1950  Age: 71 y.o. MRN: [id number]  Chief Complaint  Patient presents with   Covid Positive    Patient states the home Covid test was positive yesterday   Nasal Congestion    X2 days   Cough    Non-productive x2 days   Fatigue    X2 days   Headache    X2 days  I connected with  [name] on 02/21/22 by a TELEPHONE application and verified that I am speaking with the correct person using two identifiers. Video capability was unable to be accessed due to techinical problem.   I discussed the limitations of evaluation and management by telemedicine. The patient expressed understanding and agreed to proceed.   Location of patient: home residence Location of provider: [facility]  Started 2 days ago, reports cough and nasal congestion, fever,  Cough This is a new problem. The problem has been gradually worsening. The problem occurs constantly. Associated symptoms include a fever, headaches, myalgias and nasal congestion. The symptoms are aggravated by exercise. He has tried nothing for the symptoms.  Headache  Associated symptoms include coughing and a fever.  Pt reports that he is positive for COVID on his home test. He is about a 1 ppd smoker and has been told in the past that he has COPD. Not currently on any inhalers.    Review of Systems  Constitutional:  Positive for fever.  Respiratory:  Positive for cough.   Musculoskeletal:  Positive for myalgias.  Neurological:  Positive for headaches.  All other systems reviewed and are negative.     Objective:     There were no vitals taken for this visit.   Physical Exam Pulmonary:     Effort: Pulmonary effort is normal.     Comments: Patient is breathing comfortably over the phone, I do not hear any upper respiratory wheezing or dyspnea over the phone. Neurological:     Mental Status: He is alert and oriented to person,  place, and time.      No results found for any visits on 02/21/22.    The 10-year ASCVD risk score (Arnett DK, et al., 2019) is: 24%    Assessment & Plan:   Problem List Items Addressed This Visit       Other   Dyslipidemia   Relevant Medications   atorvastatin (LIPITOR) 40 MG tablet   COVID-19    Due to age and COPD/ smoking history, pt would be considered higher risk, I am calling in Paxlovid for the patient. I advised him to HOLD his statin medication for the next 5 days while he is taking the paxlovid. Pt was instructed to isolate for at least 7 days, then wear a mask in public for the week after that to help reduce transmission of the virus. Pt was instructed to call if his symptoms worsen.      Relevant Medications   nirmatrelvir/ritonavir EUA (PAXLOVID) 20 x 150 MG & 10 x '100MG'$  TABS   Other Visit Diagnoses     Essential hypertension       Relevant Medications   losartan-hydrochlorothiazide (HYZAAR) 100-25 MG tablet   atorvastatin (LIPITOR) 40 MG tablet      I spent 15 minutes speaking with patient on the phone. No follow-ups on file.    Farrel Conners, MD

## 2022-02-21 NOTE — Assessment & Plan Note (Signed)
Due to age and COPD/ smoking history, pt would be considered higher risk, I am calling in Paxlovid for the patient. I advised him to HOLD his statin medication for the next 5 days while he is taking the paxlovid. Pt was instructed to isolate for at least 7 days, then wear a mask in public for the week after that to help reduce transmission of the virus. Pt was instructed to call if his symptoms worsen.

## 2022-02-22 ENCOUNTER — Other Ambulatory Visit (HOSPITAL_COMMUNITY): Payer: Self-pay

## 2022-02-22 NOTE — Telephone Encounter (Signed)
Can you tell the patient and ask him where he would like it sent?

## 2022-02-25 ENCOUNTER — Other Ambulatory Visit (HOSPITAL_COMMUNITY): Payer: Self-pay

## 2022-04-26 ENCOUNTER — Other Ambulatory Visit (HOSPITAL_COMMUNITY): Payer: Self-pay

## 2022-04-26 ENCOUNTER — Ambulatory Visit (INDEPENDENT_AMBULATORY_CARE_PROVIDER_SITE_OTHER): Payer: PPO | Admitting: Family Medicine

## 2022-04-26 ENCOUNTER — Encounter: Payer: Self-pay | Admitting: Family Medicine

## 2022-04-26 VITALS — BP 128/80 | HR 83 | Temp 98.2°F | Ht 71.25 in | Wt 165.8 lb

## 2022-04-26 DIAGNOSIS — Z91030 Bee allergy status: Secondary | ICD-10-CM | POA: Diagnosis not present

## 2022-04-26 DIAGNOSIS — E785 Hyperlipidemia, unspecified: Secondary | ICD-10-CM

## 2022-04-26 DIAGNOSIS — Z1211 Encounter for screening for malignant neoplasm of colon: Secondary | ICD-10-CM | POA: Diagnosis not present

## 2022-04-26 DIAGNOSIS — R7302 Impaired glucose tolerance (oral): Secondary | ICD-10-CM

## 2022-04-26 DIAGNOSIS — Z23 Encounter for immunization: Secondary | ICD-10-CM

## 2022-04-26 DIAGNOSIS — J449 Chronic obstructive pulmonary disease, unspecified: Secondary | ICD-10-CM | POA: Insufficient documentation

## 2022-04-26 DIAGNOSIS — I10 Essential (primary) hypertension: Secondary | ICD-10-CM | POA: Diagnosis not present

## 2022-04-26 DIAGNOSIS — J432 Centrilobular emphysema: Secondary | ICD-10-CM

## 2022-04-26 MED ORDER — EPINEPHRINE 0.3 MG/0.3ML IJ SOAJ
0.3000 mg | INTRAMUSCULAR | 5 refills | Status: DC | PRN
  Filled 2022-04-26: qty 2, 30d supply, fill #0

## 2022-04-26 NOTE — Assessment & Plan Note (Signed)
Current hypertension medications:       Sig   losartan-hydrochlorothiazide (HYZAAR) 100-25 MG tablet (Taking) TAKE 1 TABLET BY MOUTH DAILY.      Well controlled on the above medication today, will continue as prescribed.

## 2022-04-26 NOTE — Assessment & Plan Note (Signed)
Continues on atorvastatin 40 mg daily, no side effects reported. Continue this as prescribed, will need new lipid panel in Feb 2024. Orders placed.

## 2022-04-26 NOTE — Assessment & Plan Note (Signed)
Last A1C was 5.9, we discussed this, will order new A1C for February.

## 2022-04-26 NOTE — Progress Notes (Signed)
Established Patient Office Visit  Subjective   Patient ID: Russell Cox, male    DOB: 09-05-1950  Age: 71 y.o. MRN: [id number]  Chief Complaint  Patient presents with   Establish Care    Patient is here for transition of care visit. Patient reports no new symptoms or complaints today. He repots that he had his CT chest this summer. We reviewed the results.   HTN -- BP in office performed and is well controlled. He  reports no side effects to the medications, no chest pain, SOB, dizziness or headaches. He has a BP cuff at home but is not checking BP regularly, report compliance with his medications.   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 Reports they have expired. States he has not been stung in about 5-6 years.  We reviewed hi HM measures, he needs repeat colonoscopy-- had 3 polyps removed in 2020, will send new referral for this.       Current Outpatient Medications  Medication Instructions   aspirin 81 mg, Oral, Daily   atorvastatin (LIPITOR) 40 MG tablet TAKE 1 TABLET BY MOUTH ONCE DAILY   EPINEPHrine (EPI-PEN) 0.3 mg, Intramuscular, As needed   FLUAD QUADRIVALENT 0.5 ML injection No dose, route, or frequency recorded.   FLUZONE HIGH-DOSE QUADRIVALENT 0.7 ML SUSY No dose, route, or frequency recorded.   losartan-hydrochlorothiazide (HYZAAR) 100-25 MG tablet TAKE 1 TABLET BY MOUTH DAILY.   PFIZER COVID-19 VAC BIVALENT injection No dose, route, or frequency recorded.    Patient Active Problem List   Diagnosis Date Noted   COVID-19 02/21/2022   Low back pain 04/05/2020   Impaired glucose tolerance 08/05/2014   History of colonic polyps 08/05/2014   Dyslipidemia 03/25/2011   Tobacco abuse 03/25/2011   Hypertension 12/05/2010      Review of Systems  All other systems reviewed and are negative.     Objective:     BP 128/80 (BP Location: Left Arm, Patient Position: Sitting, Cuff Size: Normal)   Pulse 83   Temp 98.2 F (36.8 C) (Oral)    Ht 5' 11.25" (1.81 m)   Wt 165 lb 12.8 oz (75.2 kg)   SpO2 98%   BMI 22.96 kg/m  BP Readings from Last 3 Encounters:  04/26/22 128/80  08/06/21 128/82  08/02/20 (!) 150/80      Physical Exam Vitals reviewed.  Constitutional:      Appearance: Normal appearance. He is well-groomed and normal weight.  Eyes:     Extraocular Movements: Extraocular movements intact.     Conjunctiva/sclera: Conjunctivae normal.  Neck:     Thyroid: No thyromegaly.  Cardiovascular:     Rate and Rhythm: Normal rate and regular rhythm.     Heart sounds: S1 normal and S2 normal. No murmur heard. Pulmonary:     Effort: Pulmonary effort is normal.     Breath sounds: Normal breath sounds and air entry. No rales.  Abdominal:     General: Abdomen is flat. Bowel sounds are normal.  Musculoskeletal:     Right lower leg: No edema.     Left lower leg: No edema.  Neurological:     General: No focal deficit present.     Mental Status: He is alert and oriented to person, place, and time.     Gait: Gait is intact.  Psychiatric:        Mood and Affect: Mood and affect normal.      No results found for  any visits on 04/26/22.    The 10-year ASCVD risk score (Arnett DK, et al., 2019) is: 24%    Assessment & Plan:   Problem List Items Addressed This Visit    Problem List Items Addressed This Visit       COPD (chronic obstructive pulmonary disease) ([place]) (Chronic)    Reviewed LDCT from June 2023, pt continues to smoke, lungs are clear on exam today. Will continue yearly LDCT screenings.        Unprioritized   Hypertension - Primary    Current hypertension medications:       Sig   losartan-hydrochlorothiazide (HYZAAR) 100-25 MG tablet (Taking) TAKE 1 TABLET BY MOUTH DAILY.     Well controlled on the above medication today, will continue as prescribed.       Relevant Medications   EPINEPHrine 0.3 mg/0.3 mL IJ SOAJ injection   Other Relevant Orders   CMP   Dyslipidemia    Continues on  atorvastatin 40 mg daily, no side effects reported. Continue this as prescribed, will need new lipid panel in Feb 2024. Orders placed.      Relevant Orders   Lipid Panel   Impaired glucose tolerance    Last A1C was 5.9, we discussed this, will order new A1C for February.      Relevant Orders   Hemoglobin A1c   Other Visit Diagnoses     Allergy to bee sting       Relevant Medications   Severe reactions in the past, however pt has not had any problems for the last 5 years. Rx is expired, will refill his epipen 0.3 mg injection as needed for bee sting/ anaphylaxis.  EPINEPHrine 0.3 mg/0.3 mL IJ SOAJ injection   Colon cancer screening       Relevant Orders   Ambulatory referral to Gastroenterology   Immunization due       Relevant Orders   Flu Vaccine QUAD High Dose(Fluad)       Return in about 1 year (around 04/27/2023) for Annual physical and follow up HTN.    Farrel Conners, MD

## 2022-04-26 NOTE — Assessment & Plan Note (Signed)
Reviewed LDCT from June 2023, pt continues to smoke, lungs are clear on exam today. Will continue yearly LDCT screenings.

## 2022-05-03 DIAGNOSIS — R0602 Shortness of breath: Secondary | ICD-10-CM | POA: Diagnosis not present

## 2022-05-03 DIAGNOSIS — R059 Cough, unspecified: Secondary | ICD-10-CM | POA: Diagnosis not present

## 2022-05-03 DIAGNOSIS — Z1152 Encounter for screening for COVID-19: Secondary | ICD-10-CM | POA: Diagnosis not present

## 2022-05-03 DIAGNOSIS — R5383 Other fatigue: Secondary | ICD-10-CM | POA: Diagnosis not present

## 2022-05-03 DIAGNOSIS — R0981 Nasal congestion: Secondary | ICD-10-CM | POA: Diagnosis not present

## 2022-05-03 DIAGNOSIS — R062 Wheezing: Secondary | ICD-10-CM | POA: Diagnosis not present

## 2022-05-22 DIAGNOSIS — H43812 Vitreous degeneration, left eye: Secondary | ICD-10-CM | POA: Diagnosis not present

## 2022-05-27 ENCOUNTER — Other Ambulatory Visit (HOSPITAL_COMMUNITY): Payer: Self-pay

## 2022-05-31 ENCOUNTER — Encounter: Payer: Self-pay | Admitting: Family Medicine

## 2022-05-31 DIAGNOSIS — E785 Hyperlipidemia, unspecified: Secondary | ICD-10-CM

## 2022-05-31 DIAGNOSIS — I10 Essential (primary) hypertension: Secondary | ICD-10-CM

## 2022-05-31 MED ORDER — LOSARTAN POTASSIUM-HCTZ 100-25 MG PO TABS: 1.0000 | ORAL_TABLET | Freq: Every day | ORAL | 0 refills | Status: DC

## 2022-05-31 MED ORDER — ATORVASTATIN CALCIUM 40 MG PO TABS: ORAL_TABLET | Freq: Every day | ORAL | 0 refills | Status: DC

## 2022-06-11 ENCOUNTER — Other Ambulatory Visit (HOSPITAL_COMMUNITY): Payer: Self-pay

## 2022-06-11 ENCOUNTER — Telehealth: Payer: Self-pay | Admitting: Family Medicine

## 2022-06-11 DIAGNOSIS — R051 Acute cough: Secondary | ICD-10-CM | POA: Diagnosis not present

## 2022-06-11 DIAGNOSIS — J019 Acute sinusitis, unspecified: Secondary | ICD-10-CM | POA: Diagnosis not present

## 2022-06-11 MED ORDER — METHYLPREDNISOLONE 4 MG PO TBPK
ORAL_TABLET | ORAL | 0 refills | Status: DC
  Filled 2022-06-11: qty 21, 6d supply, fill #0

## 2022-06-11 MED ORDER — PROMETHAZINE-DM 6.25-15 MG/5ML PO SYRP
ORAL_SOLUTION | ORAL | 0 refills | Status: DC
  Filled 2022-06-11: qty 120, 4d supply, fill #0

## 2022-06-11 MED ORDER — DOXYCYCLINE HYCLATE 100 MG PO TABS
ORAL_TABLET | ORAL | 0 refills | Status: DC
  Filled 2022-06-11: qty 20, 10d supply, fill #0

## 2022-06-11 NOTE — Telephone Encounter (Signed)
Spoke with the patient and informed him to seek treatment at an urgent care as PCP and none of the other [facility] offices have any openings today.  Patient asked if there were any openings tomorrow and I advised he be seen at an urgent care today due to wheezing and he agreed.

## 2022-06-11 NOTE — Telephone Encounter (Signed)
Pt is calling and was transferred to triage nurse. Pt is having wheezing and decline to go to ER

## 2022-06-24 ENCOUNTER — Ambulatory Visit: Payer: PPO

## 2022-06-25 ENCOUNTER — Encounter: Payer: Self-pay | Admitting: Gastroenterology

## 2022-07-11 ENCOUNTER — Encounter: Payer: Self-pay | Admitting: Gastroenterology

## 2022-07-15 ENCOUNTER — Ambulatory Visit (INDEPENDENT_AMBULATORY_CARE_PROVIDER_SITE_OTHER): Payer: PPO

## 2022-07-15 ENCOUNTER — Other Ambulatory Visit (INDEPENDENT_AMBULATORY_CARE_PROVIDER_SITE_OTHER): Payer: PPO

## 2022-07-15 VITALS — Ht 71.25 in | Wt 165.0 lb

## 2022-07-15 DIAGNOSIS — Z0000 Encounter for general adult medical examination without abnormal findings: Secondary | ICD-10-CM

## 2022-07-15 DIAGNOSIS — R7302 Impaired glucose tolerance (oral): Secondary | ICD-10-CM

## 2022-07-15 DIAGNOSIS — I10 Essential (primary) hypertension: Secondary | ICD-10-CM

## 2022-07-15 DIAGNOSIS — E785 Hyperlipidemia, unspecified: Secondary | ICD-10-CM | POA: Diagnosis not present

## 2022-07-15 LAB — COMPREHENSIVE METABOLIC PANEL
ALT: 23 U/L (ref 0–53)
AST: 23 U/L (ref 0–37)
Albumin: 4.4 g/dL (ref 3.5–5.2)
Alkaline Phosphatase: 70 U/L (ref 39–117)
BUN: 18 mg/dL (ref 6–23)
CO2: 28 mEq/L (ref 19–32)
Calcium: 9.7 mg/dL (ref 8.4–10.5)
Chloride: 99 mEq/L (ref 96–112)
Creatinine, Ser: 1.27 mg/dL (ref 0.40–1.50)
GFR: 56.2 mL/min — ABNORMAL LOW (ref >60.00)
Glucose, Bld: 109 mg/dL — ABNORMAL HIGH (ref 70–99)
Potassium: 4.5 mEq/L (ref 3.5–5.1)
Sodium: 133 mEq/L — ABNORMAL LOW (ref 135–145)
Total Bilirubin: 0.5 mg/dL (ref 0.2–1.2)
Total Protein: 7.5 g/dL (ref 6.0–8.3)

## 2022-07-15 LAB — LIPID PANEL
Cholesterol: 163 mg/dL (ref 0–200)
HDL: 70.6 mg/dL (ref >39.00)
LDL Cholesterol: 79 mg/dL (ref 0–99)
NonHDL: 92.1
Total CHOL/HDL Ratio: 2
Triglycerides: 64 mg/dL (ref 0.0–149.0)
VLDL: 12.8 mg/dL (ref 0.0–40.0)

## 2022-07-15 LAB — HEMOGLOBIN A1C: Hgb A1c MFr Bld: 6 % (ref 4.6–6.5)

## 2022-07-15 NOTE — Progress Notes (Signed)
Subjective:   Russell Cox is a 72 y.o. male who presents for Medicare Annual/Subsequent preventive examination.  Review of Systems    Virtual Visit via Telephone Note  I connected with  Russell Cox on 07/15/22 at 10:30 AM EST by telephone and verified that I am speaking with the correct person using two identifiers.  Location: Patient: Home Provider: Office Persons participating in the virtual visit: patient/Nurse Health Advisor   I discussed the limitations, risks, security and privacy concerns of performing an evaluation and management service by telephone and the availability of in person appointments. The patient expressed understanding and agreed to proceed.  Interactive audio and video telecommunications were attempted between this nurse and patient, however failed, due to patient having technical difficulties OR patient did not have access to video capability.  We continued and completed visit with audio only.  Some vital signs may be absent or patient reported.   Criselda Peaches, LPN  Cardiac Risk Factors include: advanced age (>89mn, >>24women);dyslipidemia;hypertension;male gender     Objective:    Today's Vitals   07/15/22 1046  Weight: 165 lb (74.8 kg)  Height: 5' 11.25" (1.81 m)   Body mass index is 22.85 kg/m.     07/15/2022   10:53 AM 06/19/2021   11:30 AM 06/13/2020    2:43 PM 03/05/2016    3:23 PM  Advanced Directives  Does Patient Have a Medical Advance Directive? Yes Yes Yes Yes  Type of AParamed[place]of [place] will [place] will [place] will [place]Living will  Copy of [place] Chart? No - copy requested No - copy requested No - copy requested     Current Medications (verified) Outpatient Encounter Medications as of 07/15/2022  Medication Sig   aspirin 81 MG tablet Take 1 tablet (81 mg total) daily by mouth.   atorvastatin  (LIPITOR) 40 MG tablet TAKE 1 TABLET BY MOUTH ONCE DAILY   EPINEPHrine 0.3 mg/0.3 mL IJ SOAJ injection Inject 0.3 mg into the muscle as needed for anaphylaxis.   FLUAD QUADRIVALENT 0.5 ML injection    FLUZONE HIGH-DOSE QUADRIVALENT 0.7 ML SUSY    losartan-hydrochlorothiazide (HYZAAR) 100-25 MG tablet TAKE 1 TABLET BY MOUTH DAILY.   methylPREDNISolone (MEDROL) 4 MG TBPK tablet Take as directed per package instructions.   PFIZER COVID-19 VAC BIVALENT injection    [DISCONTINUED] doxycycline (VIBRA-TABS) 100 MG tablet Take 1 tablet (oral) 2 times per day for 10 days   [DISCONTINUED] promethazine-dextromethorphan (PROMETHAZINE-DM) 6.25-15 MG/5ML syrup Take 5 mL (oral) every 4 to 6 hours as needed for cough for 5 days   No facility-administered encounter medications on file as of 07/15/2022.    Allergies (verified) Bee venom and Penicillins   History: Past Medical History:  Diagnosis Date   Atypical mole 03/26/2010   R Mid Lower Back - WS   Basal cell carcinoma 03/10/2006   L Side Nose - MOHs   Basal cell carcinoma (BCC) 03/26/2010   L Inferior Nose - MOHs   Cataract    removed bilaterally   Cigarette smoker    COPD (chronic obstructive [place] disease) [place]    Hyperlipidemia    Hypertension    SINUSITIS, ACUTE MAXILLARY 02/24/2007   URI 05/28/2007   Past Surgical History:  Procedure Laterality Date   CATARACT EXTRACTION W/ INTRAOCULAR LENS IMPLANT Bilateral    left   COLONOSCOPY  03/2016   repeat in 5 years  ELBOW SURGERY  1988   tennis   hemmorrhoidectomy  1988   POLYPECTOMY     SHOULDER ARTHROSCOPY W/ ACROMIAL REPAIR     left   Family History  Problem Relation Age of Onset   Cancer Mother        unknown what type   Colon cancer Mother 61   Arthritis Mother    Heart disease Father    Cancer Father        thyroid   Hypertension Sister    Hypertension Brother    Bladder Cancer Brother 81   Diabetes Maternal Grandmother    Cancer Maternal Grandfather         lung   Colon polyps Neg Hx    Esophageal cancer Neg Hx    Rectal cancer Neg Hx    Stomach cancer Neg Hx    Social History   Socioeconomic History   Marital status: Married    Spouse name: Not on file   Number of children: Not on file   Years of education: Not on file   Highest education level: Not on file  Occupational History   Not on file  Tobacco Use   Smoking status: Every Day    Packs/day: 1.00    Years: 54.00    Total pack years: 54.00    Types: Cigarettes    Start date: 23   Smokeless tobacco: Never   Tobacco comments:    DISCUSSED SMOKING CESSATION 07/10/2021  Vaping Use   Vaping Use: Never used  Substance and Sexual Activity   Alcohol use: Yes    Comment: socially   Drug use: No   Sexual activity: Not on file  Other Topics Concern   Not on file  Social History Narrative   Not on file   Social Determinants of Health   Financial Resource Strain: Low Risk  (07/15/2022)   Overall Financial Resource Strain (CARDIA)    Difficulty of Paying Living Expenses: Not hard at all  Food Insecurity: No Food Insecurity (07/15/2022)   Hunger Vital Sign    Worried About Running Out of Food in the Last Year: Never true    Ran Out of Food in the Last Year: Never true  Transportation Needs: No Transportation Needs (07/15/2022)   PRAPARE - Hydrologist (Medical): No    Lack of Transportation (Non-Medical): No  Physical Activity: Insufficiently Active (07/15/2022)   Exercise Vital Sign    Days of Exercise per Week: 3 days    Minutes of Exercise per Session: 10 min  Stress: No Stress Concern Present (07/15/2022)   [place]    Feeling of Stress : Not at all  Social Connections: Moderately Isolated (07/15/2022)   Social Connection and Isolation Panel [NHANES]    Frequency of Communication with Friends and Family: More than three times a week    Frequency of Social Gatherings with Friends  and Family: More than three times a week    Attends Religious Services: Never    Marine scientist or Organizations: No    Attends Music therapist: Never    Marital Status: Married    Tobacco Counseling Ready to quit: Not Answered Counseling given: Not Answered Tobacco comments: DISCUSSED SMOKING CESSATION 07/10/2021   Clinical Intake:  Pre-visit preparation completed: Yes  Pain : No/denies pain     BMI - recorded: 22.85 Nutritional Risks: None Diabetes: No  How often do you need  to have someone help you when you read instructions, pamphlets, or other written materials from your doctor or pharmacy?: 1 - Never  Diabetic?  No  Interpreter Needed?: No  Information entered by :: Rolene Arbour LPN   Activities of Daily Living    07/15/2022   10:52 AM 07/14/2022   12:08 PM  In your present state of health, do you have any difficulty performing the following activities:  Hearing? 0 0  Vision? 0 0  Difficulty concentrating or making decisions? 0 0  Walking or climbing stairs? 0 0  Dressing or bathing? 0 0  Doing errands, shopping? 0 0  Preparing Food and eating ? N N  Using the Toilet? N N  In the past six months, have you accidently leaked urine? N N  Do you have problems with loss of bowel control? N N  Managing your Medications? N N  Managing your Finances? N N  Housekeeping or managing your Housekeeping? N N    Patient Care Team: Farrel Conners, MD as PCP - General (Family Medicine) Lavonna Monarch, MD (Inactive) as Consulting Physician (Dermatology)  Indicate any recent Medical Services you may have received from other than [facility] providers in the past year (date may be approximate).     Assessment:   This is a routine wellness examination for [facility].  Hearing/Vision screen Hearing Screening - Comments:: Denies hearing difficulties   Vision Screening - Comments:: Wears reading glasses - up to date with routine eye exams with  [facility]  Dietary issues and exercise activities discussed: Exercise limited by: None identified   Goals Addressed               This Visit's Progress     Live another day (pt-stated)         Depression Screen    07/15/2022   10:51 AM 08/06/2021    9:50 AM 06/19/2021   11:31 AM 08/02/2020    1:25 PM 06/13/2020    2:42 PM 03/03/2019    9:05 AM 08/05/2014    9:30 AM  PHQ 2/9 Scores  PHQ - 2 Score 0 0 0 0 0 0 0  PHQ- 9 Score  0         Fall Risk    07/15/2022   10:52 AM 07/14/2022   12:08 PM 08/06/2021    8:53 AM 06/19/2021   11:31 AM 08/02/2020    1:25 PM  Fall Risk   Falls in the past year? 0 0 0 0 0  Number falls in past yr: 0  0  0  Injury with Fall? 0 0 0  0  Risk for fall due to : No Fall Risks  Other (Comment) Medication side effect   Follow up Falls prevention discussed  Falls evaluation completed Falls evaluation completed;Education provided;Falls prevention discussed     FALL RISK PREVENTION PERTAINING TO THE HOME:  Any stairs in or around the home? Yes  If so, are there any without handrails? No  Home free of loose throw rugs in walkways, pet beds, electrical cords, etc? Yes  Adequate lighting in your home to reduce risk of falls? Yes   ASSISTIVE DEVICES UTILIZED TO PREVENT FALLS:  Life alert? No  Use of a cane, walker or w/c? No  Grab bars in the bathroom? No  Shower chair or bench in shower? Yes Elevated toilet seat or a handicapped toilet? No   TIMED UP AND GO:  Was the test performed? No . Audio  Visit   Cognitive Function:        07/15/2022   10:53 AM 06/19/2021   11:38 AM 06/13/2020    2:46 PM  6CIT Screen  What Year? 0 points 0 points 0 points  What month? 0 points 0 points 0 points  What time? 0 points 0 points   Count back from 20 0 points 0 points 0 points  Months in reverse 0 points 0 points 0 points  Repeat phrase 0 points 0 points 0 points  Total Score 0 points 0 points     Immunizations Immunization History  Administered Date(s)  Administered   Fluad Quad(high Dose 65+) 03/03/2019, 03/15/2020, 04/04/2021, 04/26/2022   Influenza Split 03/25/2011, 04/20/2012   Influenza Whole 05/28/2007   Influenza, High Dose Seasonal PF 04/15/2017   Influenza,inj,Quad PF,6+ Mos 04/06/2013   Influenza-Unspecified 03/10/2014   PFIZER(Purple Top)SARS-COV-2 Vaccination 07/23/2019, 08/14/2019, 04/11/2020   PNEUMOCOCCAL CONJUGATE-20 08/06/2021   Pfizer Covid-19 Vaccine Bivalent Booster 43yr & up 04/04/2021   Pneumococcal Conjugate-13 08/05/2014   Pneumococcal Polysaccharide-23 04/15/2017   Tdap 03/25/2011   Zoster Recombinat (Shingrix) 03/17/2018, 07/27/2018, 12/08/2018    TDAP status: Up to date  Flu Vaccine status: Up to date  Pneumococcal vaccine status: Up to date  Covid-19 vaccine status: Completed vaccines  Qualifies for Shingles Vaccine? Yes   Zostavax completed Yes   Shingrix Completed?: Yes  Screening Tests Health Maintenance  Topic Date Due   COVID-19 Vaccine (5 - 2023-24 season) 07/31/2022 (Originally 02/08/2022)   COLONOSCOPY (Pts 45-41yrInsurance coverage will need to be confirmed)  07/16/2023 (Originally 04/04/2022)   Lung Cancer Screening  11/15/2022   Medicare Annual Wellness (AWV)  07/16/2023   Pneumonia Vaccine 6562Years old  Completed   INFLUENZA VACCINE  Completed   Hepatitis C Screening  Completed   Zoster Vaccines- Shingrix  Completed   HPV VACCINES  Aged Out   DTaP/Tdap/Td  Discontinued    Health Maintenance  There are no preventive care reminders to display for this patient.   Colorectal cancer screening: Referral to GI placed Patient deferred. Pt aware the office will call re: appt.  Lung Cancer Screening: (Low Dose CT Chest recommended if Age 74-80ears, 30 pack-year currently smoking OR have quit w/in 15years.) does qualify.   Lung Cancer Screening Referral: Deferred  Additional Screening:  Hepatitis C Screening: does qualify; Completed 04/15/17  Vision Screening: Recommended  annual ophthalmology exams for early detection of glaucoma and other disorders of the eye. Is the patient up to date with their annual eye exam?  Yes  Who is the provider or what is the name of the office in which the patient attends annual eye exams? [facility] If pt is not established with a provider, would they like to be referred to a provider to establish care? No .   Dental Screening: Recommended annual dental exams for proper oral hygiene  Community Resource Referral / Chronic Care Management:  CRR required this visit?  No   CCM required this visit?  No      Plan:     I have personally reviewed and noted the following in the patient's chart:   Medical and social history Use of alcohol, tobacco or illicit drugs  Current medications and supplements including opioid prescriptions. Patient is not currently taking opioid prescriptions. Functional ability and status Nutritional status Physical activity Advanced directives List of other physicians Hospitalizations, surgeries, and ER visits in previous 12 months Vitals Screenings to include cognitive, depression, and falls Referrals  and appointments  In addition, I have reviewed and discussed with patient certain preventive protocols, quality metrics, and best practice recommendations. A written personalized care plan for preventive services as well as general preventive health recommendations were provided to patient.     Criselda Peaches, LPN   01/08/174   Nurse Notes: None

## 2022-07-15 NOTE — Patient Instructions (Addendum)
Russell Cox , Thank you for taking time to come for your Medicare Wellness Visit. I appreciate your ongoing commitment to your health goals. Please review the following plan we discussed and let me know if I can assist you in the future.   These are the goals we discussed:  Goals       Live another day (pt-stated)      Patient Stated      None at this time      Patient Stated      06/19/2021, stay alive        This is a list of the screening recommended for you and due dates:  Health Maintenance  Topic Date Due   COVID-19 Vaccine (5 - 2023-24 season) 07/31/2022*   Colon Cancer Screening  07/16/2023*   Screening for Lung Cancer  11/15/2022   Medicare Annual Wellness Visit  07/16/2023   Pneumonia Vaccine  Completed   Flu Shot  Completed   Hepatitis C Screening: USPSTF Recommendation to screen - Ages 18-79 yo.  Completed   Zoster (Shingles) Vaccine  Completed   HPV Vaccine  Aged Out   DTaP/Tdap/Td vaccine  Discontinued  *Topic was postponed. The date shown is not the original due date.    Advanced directives: Please bring a copy of your health care power of attorney and living will to the office to be added to your chart at your convenience.   Conditions/risks identified: None  Next appointment: Follow up in one year for your annual wellness visit.    Preventive Care 30 Years and Older, Male  Preventive care refers to lifestyle choices and visits with your health care provider that can promote health and wellness. What does preventive care include? A yearly physical exam. This is also called an annual well check. Dental exams once or twice a year. Routine eye exams. Ask your health care provider how often you should have your eyes checked. Personal lifestyle choices, including: Daily care of your teeth and gums. Regular physical activity. Eating a healthy diet. Avoiding tobacco and drug use. Limiting alcohol use. Practicing safe sex. Taking low doses of aspirin every  day. Taking vitamin and mineral supplements as recommended by your health care provider. What happens during an annual well check? The services and screenings done by your health care provider during your annual well check will depend on your age, overall health, lifestyle risk factors, and family history of disease. Counseling  Your health care provider may ask you questions about your: Alcohol use. Tobacco use. Drug use. Emotional well-being. Home and relationship well-being. Sexual activity. Eating habits. History of falls. Memory and ability to understand (cognition). Work and work Statistician. Screening  You may have the following tests or measurements: Height, weight, and BMI. Blood pressure. Lipid and cholesterol levels. These may be checked every 5 years, or more frequently if you are over 73 years old. Skin check. Lung cancer screening. You may have this screening every year starting at age 8 if you have a 30-pack-year history of smoking and currently smoke or have quit within the past 15 years. Fecal occult blood test (FOBT) of the stool. You may have this test every year starting at age 65. Flexible sigmoidoscopy or colonoscopy. You may have a sigmoidoscopy every 5 years or a colonoscopy every 10 years starting at age 70. Prostate cancer screening. Recommendations will vary depending on your family history and other risks. Hepatitis C blood test. Hepatitis B blood test. Sexually transmitted disease (STD) testing. Diabetes  screening. This is done by checking your blood sugar (glucose) after you have not eaten for a while (fasting). You may have this done every 1-3 years. Abdominal aortic aneurysm (AAA) screening. You may need this if you are a current or former smoker. Osteoporosis. You may be screened starting at age 43 if you are at high risk. Talk with your health care provider about your test results, treatment options, and if necessary, the need for more  tests. Vaccines  Your health care provider may recommend certain vaccines, such as: Influenza vaccine. This is recommended every year. Tetanus, diphtheria, and acellular pertussis (Tdap, Td) vaccine. You may need a Td booster every 10 years. Zoster vaccine. You may need this after age 51. Pneumococcal 13-valent conjugate (PCV13) vaccine. One dose is recommended after age 3. Pneumococcal polysaccharide (PPSV23) vaccine. One dose is recommended after age 54. Talk to your health care provider about which screenings and vaccines you need and how often you need them. This information is not intended to replace advice given to you by your health care provider. Make sure you discuss any questions you have with your health care provider. Document Released: 06/23/2015 Document Revised: 02/14/2016 Document Reviewed: 03/28/2015 Elsevier Interactive Patient Education  2017 [place] Prevention in the Home Falls can cause injuries. They can happen to people of all ages. There are many things you can do to make your home safe and to help prevent falls. What can I do on the outside of my home? Regularly fix the edges of walkways and driveways and fix any cracks. Remove anything that might make you trip as you walk through a door, such as a raised step or threshold. Trim any bushes or trees on the path to your home. Use bright outdoor lighting. Clear any walking paths of anything that might make someone trip, such as rocks or tools. Regularly check to see if handrails are loose or broken. Make sure that both sides of any steps have handrails. Any raised decks and porches should have guardrails on the edges. Have any leaves, snow, or ice cleared regularly. Use sand or salt on walking paths during winter. Clean up any spills in your garage right away. This includes oil or grease spills. What can I do in the bathroom? Use night lights. Install grab bars by the toilet and in the tub and shower.  Do not use towel bars as grab bars. Use non-skid mats or decals in the tub or shower. If you need to sit down in the shower, use a plastic, non-slip stool. Keep the floor dry. Clean up any water that spills on the floor as soon as it happens. Remove soap buildup in the tub or shower regularly. Attach bath mats securely with double-sided non-slip rug tape. Do not have throw rugs and other things on the floor that can make you trip. What can I do in the bedroom? Use night lights. Make sure that you have a light by your bed that is easy to reach. Do not use any sheets or blankets that are too big for your bed. They should not hang down onto the floor. Have a firm chair that has side arms. You can use this for support while you get dressed. Do not have throw rugs and other things on the floor that can make you trip. What can I do in the kitchen? Clean up any spills right away. Avoid walking on wet floors. Keep items that you use a lot in easy-to-reach places.  If you need to reach something above you, use a strong step stool that has a grab bar. Keep electrical cords out of the way. Do not use floor polish or wax that makes floors slippery. If you must use wax, use non-skid floor wax. Do not have throw rugs and other things on the floor that can make you trip. What can I do with my stairs? Do not leave any items on the stairs. Make sure that there are handrails on both sides of the stairs and use them. Fix handrails that are broken or loose. Make sure that handrails are as long as the stairways. Check any carpeting to make sure that it is firmly attached to the stairs. Fix any carpet that is loose or worn. Avoid having throw rugs at the top or bottom of the stairs. If you do have throw rugs, attach them to the floor with carpet tape. Make sure that you have a light switch at the top of the stairs and the bottom of the stairs. If you do not have them, ask someone to add them for you. What else  can I do to help prevent falls? Wear shoes that: Do not have high heels. Have rubber bottoms. Are comfortable and fit you well. Are closed at the toe. Do not wear sandals. If you use a stepladder: Make sure that it is fully opened. Do not climb a closed stepladder. Make sure that both sides of the stepladder are locked into place. Ask someone to hold it for you, if possible. Clearly mark and make sure that you can see: Any grab bars or handrails. First and last steps. Where the edge of each step is. Use tools that help you move around (mobility aids) if they are needed. These include: Canes. Walkers. Scooters. Crutches. Turn on the lights when you go into a dark area. Replace any light bulbs as soon as they burn out. Set up your furniture so you have a clear path. Avoid moving your furniture around. If any of your floors are uneven, fix them. If there are any pets around you, be aware of where they are. Review your medicines with your doctor. Some medicines can make you feel dizzy. This can increase your chance of falling. Ask your doctor what other things that you can do to help prevent falls. This information is not intended to replace advice given to you by your health care provider. Make sure you discuss any questions you have with your health care provider. Document Released: 03/23/2009 Document Revised: 11/02/2015 Document Reviewed: 07/01/2014 Elsevier Interactive Patient Education  2017 [employer].

## 2022-07-18 ENCOUNTER — Other Ambulatory Visit (HOSPITAL_COMMUNITY): Payer: Self-pay

## 2022-07-18 MED ORDER — AZITHROMYCIN 250 MG PO TABS
ORAL_TABLET | ORAL | 0 refills | Status: AC
  Filled 2022-07-18: qty 6, 5d supply, fill #0

## 2022-07-31 ENCOUNTER — Ambulatory Visit (AMBULATORY_SURGERY_CENTER): Payer: PPO | Admitting: *Deleted

## 2022-07-31 ENCOUNTER — Other Ambulatory Visit (HOSPITAL_COMMUNITY): Payer: Self-pay

## 2022-07-31 VITALS — Ht 71.0 in | Wt 165.0 lb

## 2022-07-31 DIAGNOSIS — Z86010 Personal history of colonic polyps: Secondary | ICD-10-CM

## 2022-07-31 DIAGNOSIS — Z800 Family history of malignant neoplasm of digestive organs: Secondary | ICD-10-CM

## 2022-07-31 MED ORDER — NA SULFATE-K SULFATE-MG SULF 17.5-3.13-1.6 GM/177ML PO SOLN
1.0000 | Freq: Once | ORAL | 0 refills | Status: AC
  Filled 2022-07-31: qty 354, 1d supply, fill #0

## 2022-07-31 NOTE — Progress Notes (Signed)

## 2022-08-05 ENCOUNTER — Telehealth: Payer: Self-pay | Admitting: Physician Assistant

## 2022-08-05 NOTE — Telephone Encounter (Signed)
Patient wanting a refill on his prednisone/ former patient of Dr. Durward Fortes.Marland Kitchen

## 2022-08-06 ENCOUNTER — Encounter: Payer: Self-pay | Admitting: Gastroenterology

## 2022-08-06 DIAGNOSIS — M5417 Radiculopathy, lumbosacral region: Secondary | ICD-10-CM | POA: Diagnosis not present

## 2022-08-06 DIAGNOSIS — M5412 Radiculopathy, cervical region: Secondary | ICD-10-CM | POA: Diagnosis not present

## 2022-08-06 DIAGNOSIS — M62830 Muscle spasm of back: Secondary | ICD-10-CM | POA: Diagnosis not present

## 2022-08-06 DIAGNOSIS — M5414 Radiculopathy, thoracic region: Secondary | ICD-10-CM | POA: Diagnosis not present

## 2022-08-06 DIAGNOSIS — M5413 Radiculopathy, cervicothoracic region: Secondary | ICD-10-CM | POA: Diagnosis not present

## 2022-08-06 DIAGNOSIS — M546 Pain in thoracic spine: Secondary | ICD-10-CM | POA: Diagnosis not present

## 2022-08-06 DIAGNOSIS — M7912 Myalgia of auxiliary muscles, head and neck: Secondary | ICD-10-CM | POA: Diagnosis not present

## 2022-08-07 ENCOUNTER — Other Ambulatory Visit (HOSPITAL_COMMUNITY): Payer: Self-pay

## 2022-08-07 MED ORDER — AZITHROMYCIN 250 MG PO TABS
ORAL_TABLET | ORAL | 0 refills | Status: AC
  Filled 2022-08-07: qty 6, 5d supply, fill #0

## 2022-08-12 ENCOUNTER — Ambulatory Visit (INDEPENDENT_AMBULATORY_CARE_PROVIDER_SITE_OTHER): Payer: PPO | Admitting: Physician Assistant

## 2022-08-12 ENCOUNTER — Other Ambulatory Visit (HOSPITAL_COMMUNITY): Payer: Self-pay

## 2022-08-12 ENCOUNTER — Encounter: Payer: Self-pay | Admitting: Physician Assistant

## 2022-08-12 ENCOUNTER — Ambulatory Visit (INDEPENDENT_AMBULATORY_CARE_PROVIDER_SITE_OTHER): Payer: PPO

## 2022-08-12 DIAGNOSIS — M545 Low back pain, unspecified: Secondary | ICD-10-CM

## 2022-08-12 DIAGNOSIS — M5412 Radiculopathy, cervical region: Secondary | ICD-10-CM | POA: Diagnosis not present

## 2022-08-12 DIAGNOSIS — L821 Other seborrheic keratosis: Secondary | ICD-10-CM | POA: Diagnosis not present

## 2022-08-12 DIAGNOSIS — M7912 Myalgia of auxiliary muscles, head and neck: Secondary | ICD-10-CM | POA: Diagnosis not present

## 2022-08-12 DIAGNOSIS — M546 Pain in thoracic spine: Secondary | ICD-10-CM | POA: Diagnosis not present

## 2022-08-12 DIAGNOSIS — M5417 Radiculopathy, lumbosacral region: Secondary | ICD-10-CM | POA: Diagnosis not present

## 2022-08-12 DIAGNOSIS — Z08 Encounter for follow-up examination after completed treatment for malignant neoplasm: Secondary | ICD-10-CM | POA: Diagnosis not present

## 2022-08-12 DIAGNOSIS — C44329 Squamous cell carcinoma of skin of other parts of face: Secondary | ICD-10-CM | POA: Diagnosis not present

## 2022-08-12 DIAGNOSIS — G8929 Other chronic pain: Secondary | ICD-10-CM

## 2022-08-12 DIAGNOSIS — M5414 Radiculopathy, thoracic region: Secondary | ICD-10-CM | POA: Diagnosis not present

## 2022-08-12 DIAGNOSIS — L738 Other specified follicular disorders: Secondary | ICD-10-CM | POA: Diagnosis not present

## 2022-08-12 DIAGNOSIS — M5413 Radiculopathy, cervicothoracic region: Secondary | ICD-10-CM | POA: Diagnosis not present

## 2022-08-12 DIAGNOSIS — L814 Other melanin hyperpigmentation: Secondary | ICD-10-CM | POA: Diagnosis not present

## 2022-08-12 DIAGNOSIS — L84 Corns and callosities: Secondary | ICD-10-CM | POA: Diagnosis not present

## 2022-08-12 DIAGNOSIS — Z85828 Personal history of other malignant neoplasm of skin: Secondary | ICD-10-CM | POA: Diagnosis not present

## 2022-08-12 DIAGNOSIS — M62830 Muscle spasm of back: Secondary | ICD-10-CM | POA: Diagnosis not present

## 2022-08-12 DIAGNOSIS — L570 Actinic keratosis: Secondary | ICD-10-CM | POA: Diagnosis not present

## 2022-08-12 DIAGNOSIS — D485 Neoplasm of uncertain behavior of skin: Secondary | ICD-10-CM | POA: Diagnosis not present

## 2022-08-12 DIAGNOSIS — D1801 Hemangioma of skin and subcutaneous tissue: Secondary | ICD-10-CM | POA: Diagnosis not present

## 2022-08-12 MED ORDER — METHYLPREDNISOLONE 4 MG PO TBPK
ORAL_TABLET | ORAL | 0 refills | Status: DC
  Filled 2022-08-12: qty 21, 6d supply, fill #0
  Filled 2022-08-12: qty 21, fill #0
  Filled 2022-08-12: qty 21, 6d supply, fill #0

## 2022-08-12 NOTE — Progress Notes (Signed)
Office Visit Note   Patient: Russell Cox           Date of Birth: [birth year]           MRN: [id number] Visit Date: 08/12/2022              Requested by: Farrel Conners, [address] [place],  [place] [address] PCP: Farrel Conners, MD  Chief Complaint  Patient presents with   Lower Back - Pain      HPI: Mr. Pearo is a pleasant 72 year old gentleman who is seen Dr. Durward Fortes in the past.  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  He admits he has been more active doing some yard work.  After doing yard work he began developing pain in his lower back and had difficulty bending over to tie his left shoe.  He has done well with a steroid taper in the past.  He had not been seen in a while and was advised that he needed to be evaluated prior to prescribing this.  He did go to a wedding over the weekend and also did quite a bit of dancing.  Denies any loss of bowel or bladder control has some numbness going down his left leg  Assessment & Plan: Visit Diagnoses:  1. Chronic low back pain, unspecified back pain laterality, unspecified whether sciatica present     Plan: Findings consistent with a flare of his low back pain.  He did try chiropractic care which was not helpful.  He is very interested in doing it another Medrol Dosepak as this helped him in the past.  I will go forward and order this for him.  If he does not get adequate relief he will contact me and I will order an MRI  Follow-Up Instructions: No follow-ups on file.   Ortho Exam  Patient is alert, oriented, no adenopathy, well-dressed, normal affect, normal respiratory effort. Examination of his lower back he has no pain with forward flexion extension side-to-side bending.  His strength is 5 out of 5 with resisted dorsiflexion plantarflexion of his ankle and legs.  Sensation currently is intact.  Compartments are soft and nontender straight leg raise only mildly positive reproduces some pain in his  left posterior buttock  Imaging: XR Lumbar Spine 2-3 Views  Result Date: 08/12/2022 Radiographs of the lumbar spine were reviewed today.  He does have overall well-maintained alignment.  He does have degenerative changes throughout the lower spine especially at L5-S1 and facet arthropathy.  No acute changes x-rays are stable as compared to those with 2021  No images are attached to the encounter.  Labs: Lab Results  Component Value Date   HGBA1C 6.0 07/15/2022   HGBA1C 5.9 08/06/2021   HGBA1C 5.7 08/02/2020     Lab Results  Component Value Date   ALBUMIN 4.4 07/15/2022   ALBUMIN 4.8 08/06/2021   ALBUMIN 4.6 08/02/2020    No results found for: "MG" No results found for: "VD25OH"  No results found for: "PREALBUMIN"    Latest Ref Rng & Units 08/06/2021    9:51 AM 08/02/2020    2:23 PM 12/08/2019    3:38 PM  CBC EXTENDED  WBC 4.0 - 10.5 K/uL 6.1  6.7  6.1   RBC 4.22 - 5.81 Mil/uL 4.94  4.46  4.19   Hemoglobin 13.0 - 17.0 g/dL 15.9  14.9  14.0   HCT 39.0 - 52.0 % 48.3  43.1  41.0  Platelets 150.0 - 400.0 K/uL 192.0  208.0  194.0   NEUT# 1.4 - 7.7 K/uL 3.7  4.0  3.8   Lymph# 0.7 - 4.0 K/uL 1.4  1.8  1.4      There is no height or weight on file to calculate BMI.  Orders:  Orders Placed This Encounter  Procedures   XR Lumbar Spine 2-3 Views   Meds ordered this encounter  Medications   methylPREDNISolone (MEDROL DOSEPAK) 4 MG TBPK tablet    Sig: Take as directed with food    Dispense:  21 tablet    Refill:  0     Procedures: No procedures performed  Clinical Data: No additional findings.  ROS:  All other systems negative, except as noted in the HPI. Review of Systems  Objective: Vital Signs: There were no vitals taken for this visit.  Specialty Comments:  No specialty comments available.  PMFS History: Patient Active Problem List   Diagnosis Date Noted   COPD (chronic obstructive pulmonary disease) ([facility]) 04/26/2022   COVID-19 02/21/2022   Low back  pain 04/05/2020   Impaired glucose tolerance 08/05/2014   History of colonic polyps 08/05/2014   Dyslipidemia 03/25/2011   Tobacco abuse 03/25/2011   Hypertension 12/05/2010   Past Medical History:  Diagnosis Date   Allergy    seasonal   Arthritis    "a little bit,back,elbows".   Asthma    as a child   Atypical mole 03/26/2010   R Mid Lower Back - WS   Basal cell carcinoma 03/10/2006   L Side Nose - MOHs   Basal cell carcinoma (BCC) 03/26/2010   L Inferior Nose - MOHs   Cataract    removed bilaterally   Cigarette smoker    COPD (chronic obstructive pulmonary disease) ([facility])    Hyperlipidemia    Hypertension    SINUSITIS, ACUTE MAXILLARY 02/24/2007   URI 05/28/2007    Family History  Problem Relation Age of Onset   Cancer Mother        unknown what type   Colon cancer Mother 56   Arthritis Mother    Heart disease Father    Cancer Father        thyroid   Hypertension Sister    Hypertension Brother    Bladder Cancer Brother 60   Diabetes Maternal Grandmother    Cancer Maternal Grandfather        lung   Colon polyps Child    Esophageal cancer Neg Hx    Rectal cancer Neg Hx    Stomach cancer Neg Hx    Crohn's disease Neg Hx    Ulcerative colitis Neg Hx     Past Surgical History:  Procedure Laterality Date   CATARACT EXTRACTION W/ INTRAOCULAR LENS IMPLANT Bilateral    left   COLONOSCOPY  03/2016   repeat in 5 years   ELBOW SURGERY  1988   tennis   hemmorrhoidectomy  1988   POLYPECTOMY     SHOULDER ARTHROSCOPY W/ ACROMIAL REPAIR     left   Social History   Occupational History   Not on file  Tobacco Use   Smoking status: Every Day    Packs/day: 1.00    Years: 54.00    Total pack years: 54.00    Types: Cigarettes    Start date: 109   Smokeless tobacco: Never   Tobacco comments:    DISCUSSED SMOKING CESSATION 07/10/2021  Vaping Use   Vaping Use: Never used  Substance and Sexual  Activity   Alcohol use: Yes    Comment: socially   Drug use: No    Sexual activity: Not on file

## 2022-08-21 ENCOUNTER — Encounter: Payer: Self-pay | Admitting: Gastroenterology

## 2022-08-21 ENCOUNTER — Ambulatory Visit (AMBULATORY_SURGERY_CENTER): Payer: PPO | Admitting: Gastroenterology

## 2022-08-21 VITALS — BP 107/68 | HR 64 | Temp 96.6°F | Resp 12 | Ht 71.0 in | Wt 165.0 lb

## 2022-08-21 DIAGNOSIS — Z86010 Personal history of colonic polyps: Secondary | ICD-10-CM | POA: Diagnosis not present

## 2022-08-21 DIAGNOSIS — Z09 Encounter for follow-up examination after completed treatment for conditions other than malignant neoplasm: Secondary | ICD-10-CM | POA: Diagnosis not present

## 2022-08-21 DIAGNOSIS — D128 Benign neoplasm of rectum: Secondary | ICD-10-CM

## 2022-08-21 DIAGNOSIS — D123 Benign neoplasm of transverse colon: Secondary | ICD-10-CM | POA: Diagnosis not present

## 2022-08-21 DIAGNOSIS — J449 Chronic obstructive pulmonary disease, unspecified: Secondary | ICD-10-CM | POA: Diagnosis not present

## 2022-08-21 DIAGNOSIS — D124 Benign neoplasm of descending colon: Secondary | ICD-10-CM

## 2022-08-21 DIAGNOSIS — Z800 Family history of malignant neoplasm of digestive organs: Secondary | ICD-10-CM | POA: Diagnosis not present

## 2022-08-21 DIAGNOSIS — I10 Essential (primary) hypertension: Secondary | ICD-10-CM | POA: Diagnosis not present

## 2022-08-21 MED ORDER — SODIUM CHLORIDE 0.9 % IV SOLN: 500.0000 mL | Freq: Once | INTRAVENOUS | Status: DC

## 2022-08-21 NOTE — Op Note (Addendum)
[facility] Patient Name: Russell Cox Procedure Date: 08/21/2022 8:25 AM MRN: [id number] Endoscopist: Ladene Artist , MD, [id number] Age: 72 Referring MD:  Date of Birth: 01/03/1951 Gender: Male Account #: [id number] Procedure:                Colonoscopy Indications:              Surveillance: Personal history of adenomatous                            polyps on last colonoscopy 3 years ago, Family                            history of colon cancer, 1st-degree relative. Medicines:                Monitored Anesthesia Care Procedure:                Pre-Anesthesia Assessment:                           - Prior to the procedure, a History and Physical                            was performed, and patient medications and                            allergies were reviewed. The patient's tolerance of                            previous anesthesia was also reviewed. The risks                            and benefits of the procedure and the sedation                            options and risks were discussed with the patient.                            All questions were answered, and informed consent                            was obtained. Prior Anticoagulants: The patient has                            taken no anticoagulant or antiplatelet agents. ASA                            Grade Assessment: II - A patient with mild systemic                            disease. After reviewing the risks and benefits,                            the patient was deemed in satisfactory condition to  undergo the procedure.                           After obtaining informed consent, the colonoscope                            was passed under direct vision. Throughout the                            procedure, the patient's blood pressure, pulse, and                            oxygen saturations were monitored continuously. The                            Olympus SN [id number] was  introduced through the anus                            and advanced to the the cecum, identified by                            appendiceal orifice and ileocecal valve. The                            ileocecal valve, appendiceal orifice, and rectum                            were photographed. The quality of the bowel                            preparation was good. The colonoscopy was performed                            without difficulty. The patient tolerated the                            procedure well. Scope In: 8:36:31 AM Scope Out: 8:55:04 AM Scope Withdrawal Time: 0 hours 15 minutes 5 seconds  Total Procedure Duration: 0 hours 18 minutes 33 seconds  Findings:                 The perianal and digital rectal examinations were                            normal.                           Four sessile polyps were found in the rectum,                            descending colon and transverse colon (2). The                            polyps were 6 to 8 mm in size. These polyps were  removed with a cold snare. Resection and retrieval                            were complete.                           A single medium-sized localized angioectasia                            without bleeding was found in the cecum.                           A few small-mouthed diverticula were found in the                            left colon.                           The exam was otherwise without abnormality on                            direct and retroflexion views. Complications:            No immediate complications. Estimated blood loss:                            None. Estimated Blood Loss:     Estimated blood loss: none. Impression:               - Four 6 to 8 mm polyps in the rectum, in the                            descending colon and in the transverse colon,                            removed with a cold snare. Resected and retrieved.                           -  Single angioectasia in the cecum.                           - Mild diverticulosis in the left colon.                           - The examination was otherwise normal on direct                            and retroflexion views. Recommendation:           - Repeat colonoscopy date to be determined after                            pending pathology results are reviewed for                            surveillance vs no repeat due to age based on  pathology results.                           - Patient has a contact number available for                            emergencies. The signs and symptoms of potential                            delayed complications were discussed with the                            patient. Return to normal activities tomorrow.                            Written discharge instructions were provided to the                            patient.                           - Resume previous diet.                           - Continue present medications.                           - Await pathology results. Ladene Artist, MD 08/21/2022 9:00:38 AM This report has been signed electronically.

## 2022-08-21 NOTE — Progress Notes (Signed)
Called to room to assist during endoscopic procedure.  Patient ID and intended procedure confirmed with present staff. Received instructions for my participation in the procedure from the performing physician.  

## 2022-08-21 NOTE — Patient Instructions (Signed)
-  Handout on polyps and diverticulosis provided -await pathology results -repeat colonoscopy for surveillance recommended. Date to be determined when pathology result become available   -Continue present medications   YOU HAD AN ENDOSCOPIC PROCEDURE TODAY AT [facility]:   Refer to the procedure report that was given to you for any specific questions about what was found during the examination.  If the procedure report does not answer your questions, please call your gastroenterologist to clarify.  If you requested that your care partner not be given the details of your procedure findings, then the procedure report has been included in a sealed envelope for you to review at your convenience later.  YOU SHOULD EXPECT: Some feelings of bloating in the abdomen. Passage of more gas than usual.  Walking can help get rid of the air that was put into your GI tract during the procedure and reduce the bloating. If you had a lower endoscopy (such as a colonoscopy or flexible sigmoidoscopy) you may notice spotting of blood in your stool or on the toilet paper. If you underwent a bowel prep for your procedure, you may not have a normal bowel movement for a few days.  Please Note:  You might notice some irritation and congestion in your nose or some drainage.  This is from the oxygen used during your procedure.  There is no need for concern and it should clear up in a day or so.  SYMPTOMS TO REPORT IMMEDIATELY:  Following lower endoscopy (colonoscopy or flexible sigmoidoscopy):  Excessive amounts of blood in the stool  Significant tenderness or worsening of abdominal pains  Swelling of the abdomen that is new, acute  Fever of 100F or higher   For urgent or emergent issues, a gastroenterologist can be reached at any hour by calling [phone]. Do not use MyChart messaging for urgent concerns.    DIET:  We do recommend a small meal at first, but then you may proceed to your regular  diet.  Drink plenty of fluids but you should avoid alcoholic beverages for 24 hours.  ACTIVITY:  You should plan to take it easy for the rest of today and you should NOT DRIVE or use heavy machinery until tomorrow (because of the sedation medicines used during the test).    FOLLOW UP: Our staff will call the number listed on your records the next business day following your procedure.  We will call around 7:15- 8:00 am to check on you and address any questions or concerns that you may have regarding the information given to you following your procedure. If we do not reach you, we will leave a message.     If any biopsies were taken you will be contacted by phone or by letter within the next 1-3 weeks.  Please call us at [phone] if you have not heard about the biopsies in 3 weeks.    SIGNATURES/CONFIDENTIALITY: You and/or your care partner have signed paperwork which will be entered into your electronic medical record.  These signatures attest to the fact that that the information above on your After Visit Summary has been reviewed and is understood.  Full responsibility of the confidentiality of this discharge information lies with you and/or your care-partner.

## 2022-08-21 NOTE — Progress Notes (Signed)
Pt's states no medical or surgical changes since previsit or office visit. VS assessed by E.C 

## 2022-08-21 NOTE — Progress Notes (Signed)
History & Physical  Primary Care Physician:  Farrel Conners, MD Primary Gastroenterologist: Lucio Edward, MD  Impression / Plan:  Personal history of adenomatous colon polyps, family history of colon cancer first-degree relative for colonoscopy.  CHIEF COMPLAINT:  Russell Cox, [name]   HPI: Russell Cox is a 72 y.o. male with a personal history of adenomatous colon polyps, family history of colon cancer first-degree relative for colonoscopy.   Past Medical History:  Diagnosis Date   Allergy    seasonal   Arthritis    "a little bit,back,elbows".   Asthma    as a child   Atypical mole 03/26/2010   R Mid Lower Back - WS   Basal cell carcinoma 03/10/2006   L Side Nose - MOHs   Basal cell carcinoma (BCC) 03/26/2010   L Inferior Nose - MOHs   Cataract    removed bilaterally   Cigarette smoker    COPD (chronic obstructive pulmonary disease) ([facility])    Hyperlipidemia    Hypertension    SINUSITIS, ACUTE MAXILLARY 02/24/2007   URI 05/28/2007    Past Surgical History:  Procedure Laterality Date   CATARACT EXTRACTION W/ INTRAOCULAR LENS IMPLANT Bilateral    left   COLONOSCOPY  03/2016   repeat in 5 years   ELBOW SURGERY  1988   tennis   hemmorrhoidectomy  [place]     left    Prior to Admission medications   Medication Sig Start Date End Date Taking? Authorizing Provider  aspirin 81 MG tablet Take 1 tablet (81 mg total) daily by mouth. 04/15/17   Marletta Lor, MD  atorvastatin (LIPITOR) 40 MG tablet TAKE 1 TABLET BY MOUTH ONCE DAILY 05/31/22 05/31/23  Farrel Conners, MD  EPINEPHrine 0.3 mg/0.3 mL IJ SOAJ injection Inject 0.3 mg into the muscle as needed for anaphylaxis. 04/26/22   Farrel Conners, MD  losartan-hydrochlorothiazide (HYZAAR) 100-25 MG tablet TAKE 1 TABLET BY MOUTH DAILY. 05/31/22 05/31/23  Farrel Conners, MD  methylPREDNISolone (MEDROL DOSEPAK) 4 MG TBPK tablet  Take as directed with food 08/12/22   Persons, Bevely Palmer, [place]    Current Outpatient Medications  Medication Sig Dispense Refill   aspirin 81 MG tablet Take 1 tablet (81 mg total) daily by mouth. 30 tablet    atorvastatin (LIPITOR) 40 MG tablet TAKE 1 TABLET BY MOUTH ONCE DAILY 5 tablet 0   EPINEPHrine 0.3 mg/0.3 mL IJ SOAJ injection Inject 0.3 mg into the muscle as needed for anaphylaxis. 1 each 5   losartan-hydrochlorothiazide (HYZAAR) 100-25 MG tablet TAKE 1 TABLET BY MOUTH DAILY. 5 tablet 0   methylPREDNISolone (MEDROL DOSEPAK) 4 MG TBPK tablet Take as directed with food 21 tablet 0   Current Facility-Administered Medications  Medication Dose Route Frequency Provider Last Rate Last Admin   0.9 %  sodium chloride infusion  500 mL Intravenous Once Ladene Artist, MD        Allergies as of 08/21/2022 - Review Complete 08/21/2022  Allergen Reaction Noted   Bee venom Anaphylaxis 03/05/2016   Penicillins Hives     Family History  Problem Relation Age of Onset   Cancer Mother        unknown what type   Colon cancer Mother 91   Arthritis Mother    Heart disease Father    Cancer Father        thyroid   Hypertension Sister  Hypertension Brother    Bladder Cancer Brother 70   Diabetes Maternal Grandmother    Cancer Maternal Grandfather        lung   Colon polyps Child    Esophageal cancer Neg Hx    Rectal cancer Neg Hx    Stomach cancer Neg Hx    Crohn's disease Neg Hx    Ulcerative colitis Neg Hx     Social History   Socioeconomic History   Marital status: Married    Spouse name: Not on file   Number of children: Not on file   Years of education: Not on file   Highest education level: Not on file  Occupational History   Not on file  Tobacco Use   Smoking status: Every Day    Packs/day: 1.00    Years: 54.00    Total pack years: 54.00    Types: Cigarettes    Start date: 78   Smokeless tobacco: Never   Tobacco comments:    DISCUSSED SMOKING CESSATION 07/10/2021   Vaping Use   Vaping Use: Never used  Substance and Sexual Activity   Alcohol use: Yes    Comment: socially   Drug use: No   Sexual activity: Not on file  Other Topics Concern   Not on file  Social History Narrative   Not on file   Social Determinants of Health   Financial Resource Strain: Low Risk  (07/15/2022)   Overall Financial Resource Strain (CARDIA)    Difficulty of Paying Living Expenses: Not hard at all  Food Insecurity: No Food Insecurity (07/15/2022)   Hunger Vital Sign    Worried About Running Out of Food in the Last Year: Never true    [place] in the Last Year: Never true  Transportation Needs: No Transportation Needs (07/15/2022)   PRAPARE - Hydrologist (Medical): No    Lack of Transportation (Non-Medical): No  Physical Activity: Insufficiently Active (07/15/2022)   Exercise Vital Sign    Days of Exercise per Week: 3 days    Minutes of Exercise per Session: 10 min  Stress: No Stress Concern Present (07/15/2022)   [place]    Feeling of Stress : Not at all  Social Connections: Moderately Isolated (07/15/2022)   Social Connection and Isolation Panel [NHANES]    Frequency of Communication with Friends and Family: More than three times a week    Frequency of Social Gatherings with Friends and Family: More than three times a week    Attends Religious Services: Never    Marine scientist or Organizations: No    Attends Archivist Meetings: Never    Marital Status: Married  Human resources officer Violence: Not At Risk (07/15/2022)   Humiliation, Afraid, Rape, and Kick questionnaire    Fear of Current or Ex-Partner: No    Emotionally Abused: No    Physically Abused: No    Sexually Abused: No    Review of Systems:  All systems reviewed were negative except where noted in HPI.   Physical Exam: General:  Alert, well-developed, in NAD Head:  Normocephalic and  atraumatic. Eyes:  Sclera clear, no icterus.   Conjunctiva pink. Ears:  Normal auditory acuity. Mouth:  No deformity or lesions.  Neck:  Supple; no masses. Lungs:  Clear throughout to auscultation.   No wheezes, crackles, or rhonchi.  Heart:  Regular rate and rhythm; no murmurs. Abdomen:  Soft, nondistended,  nontender. No masses, hepatomegaly. No palpable masses.  Normal bowel sounds.    Rectal:  Deferred   Msk:  Symmetrical without gross deformities. Extremities:  Without edema. Neurologic:  Alert and  oriented x 4; grossly normal neurologically. Skin:  Intact without significant lesions or rashes. Psych:  Alert and cooperative. Normal mood and affect.   Pricilla Riffle. Fuller Plan  08/21/2022, 8:27 AM See Shea Evans, [facility], to contact our on call provider

## 2022-08-22 ENCOUNTER — Telehealth: Payer: Self-pay

## 2022-08-22 NOTE — Telephone Encounter (Signed)
Left message on follow up call. 

## 2022-08-28 ENCOUNTER — Encounter: Payer: Self-pay | Admitting: Gastroenterology

## 2022-09-02 DIAGNOSIS — C44329 Squamous cell carcinoma of skin of other parts of face: Secondary | ICD-10-CM | POA: Diagnosis not present

## 2022-09-05 ENCOUNTER — Other Ambulatory Visit: Payer: Self-pay | Admitting: Family Medicine

## 2022-09-05 ENCOUNTER — Other Ambulatory Visit: Payer: Self-pay

## 2022-09-05 ENCOUNTER — Other Ambulatory Visit (HOSPITAL_COMMUNITY): Payer: Self-pay

## 2022-09-05 DIAGNOSIS — I10 Essential (primary) hypertension: Secondary | ICD-10-CM

## 2022-09-05 DIAGNOSIS — E785 Hyperlipidemia, unspecified: Secondary | ICD-10-CM

## 2022-09-05 MED ORDER — ATORVASTATIN CALCIUM 40 MG PO TABS
40.0000 mg | ORAL_TABLET | Freq: Every day | ORAL | 1 refills | Status: DC
  Filled 2022-09-05: qty 90, 90d supply, fill #0
  Filled 2022-12-03: qty 90, 90d supply, fill #1

## 2022-09-05 MED ORDER — LOSARTAN POTASSIUM-HCTZ 100-25 MG PO TABS
1.0000 | ORAL_TABLET | Freq: Every day | ORAL | 1 refills | Status: DC
  Filled 2022-09-05: qty 90, 90d supply, fill #0
  Filled 2022-12-03: qty 90, 90d supply, fill #1

## 2022-10-05 ENCOUNTER — Other Ambulatory Visit (HOSPITAL_COMMUNITY): Payer: Self-pay

## 2022-10-15 ENCOUNTER — Other Ambulatory Visit: Payer: Self-pay | Admitting: Acute Care

## 2022-10-15 DIAGNOSIS — Z122 Encounter for screening for malignant neoplasm of respiratory organs: Secondary | ICD-10-CM

## 2022-10-15 DIAGNOSIS — F17210 Nicotine dependence, cigarettes, uncomplicated: Secondary | ICD-10-CM

## 2022-10-15 DIAGNOSIS — Z87891 Personal history of nicotine dependence: Secondary | ICD-10-CM

## 2022-11-18 ENCOUNTER — Ambulatory Visit (HOSPITAL_COMMUNITY): Payer: PPO

## 2022-11-29 ENCOUNTER — Ambulatory Visit (HOSPITAL_COMMUNITY)
Admission: RE | Admit: 2022-11-29 | Discharge: 2022-11-29 | Disposition: A | Discharge status: Home / Self Care | Payer: PPO | Source: Ambulatory Visit | Attending: Acute Care | Admitting: Acute Care

## 2022-11-29 ENCOUNTER — Encounter (HOSPITAL_COMMUNITY): Payer: Self-pay

## 2022-11-29 DIAGNOSIS — Z87891 Personal history of nicotine dependence: Secondary | ICD-10-CM | POA: Diagnosis not present

## 2022-11-29 DIAGNOSIS — Z122 Encounter for screening for malignant neoplasm of respiratory organs: Secondary | ICD-10-CM | POA: Diagnosis not present

## 2022-11-29 DIAGNOSIS — F17210 Nicotine dependence, cigarettes, uncomplicated: Secondary | ICD-10-CM | POA: Diagnosis not present

## 2022-12-03 ENCOUNTER — Other Ambulatory Visit (HOSPITAL_COMMUNITY): Payer: Self-pay

## 2022-12-04 ENCOUNTER — Other Ambulatory Visit: Payer: Self-pay

## 2022-12-04 DIAGNOSIS — F17210 Nicotine dependence, cigarettes, uncomplicated: Secondary | ICD-10-CM

## 2022-12-04 DIAGNOSIS — Z87891 Personal history of nicotine dependence: Secondary | ICD-10-CM

## 2022-12-30 ENCOUNTER — Other Ambulatory Visit: Payer: Self-pay | Admitting: Physician Assistant

## 2023-01-02 ENCOUNTER — Telehealth: Payer: Self-pay | Admitting: Physician Assistant

## 2023-01-02 NOTE — Telephone Encounter (Signed)
Patient called to see if he can get a refill on prednisone. CB#[phone]

## 2023-01-03 ENCOUNTER — Other Ambulatory Visit (HOSPITAL_COMMUNITY): Payer: Self-pay

## 2023-01-03 ENCOUNTER — Ambulatory Visit: Payer: PPO | Admitting: Physician Assistant

## 2023-01-03 ENCOUNTER — Other Ambulatory Visit: Payer: Self-pay

## 2023-01-03 ENCOUNTER — Encounter: Payer: Self-pay | Admitting: Physician Assistant

## 2023-01-03 DIAGNOSIS — M5440 Lumbago with sciatica, unspecified side: Secondary | ICD-10-CM | POA: Diagnosis not present

## 2023-01-03 MED ORDER — METHYLPREDNISOLONE 4 MG PO TBPK
ORAL_TABLET | ORAL | 0 refills | Status: DC
  Filled 2023-01-03: qty 21, 6d supply, fill #0

## 2023-01-03 NOTE — Progress Notes (Signed)
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  He is very active.  Occasionally he flares his back and requests a Medrol Dosepak.  Recently he was lifting a golf cart battery out of a golf cart and he strained his back.  Denies any paresthesias any loss of bowel or bladder control any weakness just has pain focally in the lower back.  Describes his pain to mild-moderate very similar how it has been in the past  Assessment & Plan: Visit Diagnoses: Chronic low back pain  Plan: We discussed treatment options he is neurovascular intact and doing very well today except for the pain focused in his back I do not think he sustained a compression fracture I think this is more of a back strain.  I will refill his Medrol Dosepak which has helped him in the past.  We also briefly discussed if his pain be came more chronic rather than episodic we could also consider getting an MRI and referring him to Dr. Alvester Morin he is in agreement with this plan he will contact me if he wishes to do this  Follow-Up Instructions: No follow-ups on file.   Ortho Exam  Patient is alert, oriented, no adenopathy, well-dressed, normal affect, normal respiratory effort. Examination of his lower back he has no redness no erythema no cellulitis.  He is focally tender over the fifth paravertebral muscles and facet joints of the lower back.  He is neurovascular intact no sensation changes his strength in his lower extremities is 5 out of 5 with resisted dorsiflexion and plantarflexion of his ankles extension and flexion of his legs no pain with manipulation of his  hips  Imaging: No results found. No images are attached to the encounter.  Labs: Lab Results  Component Value Date   HGBA1C 6.0 07/15/2022   HGBA1C 5.9 08/06/2021   HGBA1C 5.7 08/02/2020     Lab Results  Component Value Date   ALBUMIN 4.4 07/15/2022   ALBUMIN 4.8 08/06/2021   ALBUMIN 4.6 08/02/2020    No results found for: "MG" No results found for: "VD25OH"  No results found for: "PREALBUMIN"    Latest Ref Rng & Units 08/06/2021    9:51 AM 08/02/2020    2:23 PM 12/08/2019    3:38 PM  CBC EXTENDED  WBC 4.0 - 10.5 K/uL 6.1  6.7  6.1   RBC 4.22 - 5.81 Mil/uL 4.94  4.46  4.19   Hemoglobin 13.0 - 17.0 g/dL 62.1  30.8  65.7   HCT 39.0 - 52.0 % 48.3  43.1  41.0   Platelets 150.0 - 400.0 K/uL 192.0  208.0  194.0   NEUT# 1.4 - 7.7 K/uL 3.7  4.0  3.8   Lymph# 0.7 - 4.0 K/uL 1.4  1.8  1.4      There is no height or weight on file to calculate BMI.  Orders:  No orders of the defined types were placed in  this encounter.  Meds ordered this encounter  Medications   methylPREDNISolone (MEDROL DOSEPAK) 4 MG TBPK tablet    Sig: Take as directed with food    Dispense:  21 tablet    Refill:  0     Procedures: No procedures performed  Clinical Data: No additional findings.  ROS:  All other systems negative, except as noted in the HPI. Review of Systems  Objective: Vital Signs: There were no vitals taken for this visit.  Specialty Comments:  No specialty comments available.  PMFS History: Patient Active Problem List   Diagnosis Date Noted   COPD (chronic obstructive pulmonary disease) (HCC) 04/26/2022   COVID-19 02/21/2022   Low back pain 04/05/2020   Impaired glucose tolerance 08/05/2014   History of colonic polyps 08/05/2014   Dyslipidemia 03/25/2011   Tobacco abuse 03/25/2011   Hypertension 12/05/2010   Past Medical History:  Diagnosis Date   Allergy    seasonal   Arthritis    "a little bit,back,elbows".   Asthma    as a child   Atypical mole  03/26/2010   R Mid Lower Back - WS   Basal cell carcinoma 03/10/2006   L Side Nose - MOHs   Basal cell carcinoma (BCC) 03/26/2010   L Inferior Nose - MOHs   Cataract    removed bilaterally   Cigarette smoker    COPD (chronic obstructive pulmonary disease) (HCC)    Hyperlipidemia    Hypertension    SINUSITIS, ACUTE MAXILLARY 02/24/2007   URI 05/28/2007    Family History  Problem Relation Age of Onset   Cancer Mother        unknown what type   Colon cancer Mother 58   Arthritis Mother    Heart disease Father    Cancer Father        thyroid   Hypertension Sister    Hypertension Brother    Bladder Cancer Brother 46   Diabetes Maternal Grandmother    Cancer Maternal Grandfather        lung   Colon polyps Child    Esophageal cancer Neg Hx    Rectal cancer Neg Hx    Stomach cancer Neg Hx    Crohn's disease Neg Hx    Ulcerative colitis Neg Hx     Past Surgical History:  Procedure Laterality Date   CATARACT EXTRACTION W/ INTRAOCULAR LENS IMPLANT Bilateral    left   COLONOSCOPY  03/2016   repeat in 5 years   ELBOW SURGERY  1988   tennis   hemmorrhoidectomy  1988   POLYPECTOMY     SHOULDER ARTHROSCOPY W/ ACROMIAL REPAIR     left   Social History   Occupational History   Not on file  Tobacco Use   Smoking status: Every Day    Current packs/day: 1.00    Average packs/day: 1 pack/day for 54.0 years (54.0 ttl pk-yrs)    Types: Cigarettes    Start date: 2   Smokeless tobacco: Never   Tobacco comments:    DISCUSSED SMOKING CESSATION 07/10/2021  Vaping Use   Vaping status: Never Used  Substance and Sexual Activity   Alcohol use: Yes    Comment: socially   Drug use: No   Sexual activity: Not on file

## 2023-01-17 DIAGNOSIS — L508 Other urticaria: Secondary | ICD-10-CM | POA: Diagnosis not present

## 2023-02-14 ENCOUNTER — Other Ambulatory Visit: Payer: Self-pay | Admitting: Medical Genetics

## 2023-02-14 DIAGNOSIS — Z006 Encounter for examination for normal comparison and control in clinical research program: Secondary | ICD-10-CM

## 2023-03-10 ENCOUNTER — Other Ambulatory Visit: Payer: Self-pay | Admitting: Family Medicine

## 2023-03-10 ENCOUNTER — Other Ambulatory Visit (HOSPITAL_COMMUNITY): Payer: Self-pay

## 2023-03-10 DIAGNOSIS — E785 Hyperlipidemia, unspecified: Secondary | ICD-10-CM

## 2023-03-10 DIAGNOSIS — I10 Essential (primary) hypertension: Secondary | ICD-10-CM

## 2023-03-13 ENCOUNTER — Other Ambulatory Visit (HOSPITAL_COMMUNITY): Payer: Self-pay

## 2023-03-13 ENCOUNTER — Other Ambulatory Visit: Payer: Self-pay

## 2023-03-13 ENCOUNTER — Other Ambulatory Visit: Payer: Self-pay | Admitting: Family Medicine

## 2023-03-13 DIAGNOSIS — I10 Essential (primary) hypertension: Secondary | ICD-10-CM

## 2023-03-13 DIAGNOSIS — E785 Hyperlipidemia, unspecified: Secondary | ICD-10-CM

## 2023-03-13 MED ORDER — LOSARTAN POTASSIUM-HCTZ 100-25 MG PO TABS
1.0000 | ORAL_TABLET | Freq: Every day | ORAL | 0 refills | Status: DC
  Filled 2023-03-13: qty 90, 90d supply, fill #0

## 2023-03-13 MED ORDER — ATORVASTATIN CALCIUM 40 MG PO TABS
40.0000 mg | ORAL_TABLET | Freq: Every day | ORAL | 0 refills | Status: DC
Start: 2023-03-13 — End: 2023-04-28
  Filled 2023-03-13: qty 90, 90d supply, fill #0

## 2023-03-13 NOTE — Telephone Encounter (Signed)
Spouse called to say she has questions regarding a form she received from the pharmacy regarding Rxs. CMA was unavailable. Please return call, at your earliest convenience.

## 2023-04-28 ENCOUNTER — Ambulatory Visit: Payer: PPO | Admitting: Family Medicine

## 2023-04-28 ENCOUNTER — Encounter: Payer: Self-pay | Admitting: Family Medicine

## 2023-04-28 ENCOUNTER — Other Ambulatory Visit: Payer: Self-pay

## 2023-04-28 ENCOUNTER — Other Ambulatory Visit (HOSPITAL_COMMUNITY): Payer: Self-pay

## 2023-04-28 VITALS — BP 142/84 | HR 85 | Temp 97.9°F | Ht 71.5 in | Wt 164.4 lb

## 2023-04-28 DIAGNOSIS — E785 Hyperlipidemia, unspecified: Secondary | ICD-10-CM | POA: Diagnosis not present

## 2023-04-28 DIAGNOSIS — Z91030 Bee allergy status: Secondary | ICD-10-CM

## 2023-04-28 DIAGNOSIS — Z0000 Encounter for general adult medical examination without abnormal findings: Secondary | ICD-10-CM

## 2023-04-28 DIAGNOSIS — R7302 Impaired glucose tolerance (oral): Secondary | ICD-10-CM

## 2023-04-28 DIAGNOSIS — I10 Essential (primary) hypertension: Secondary | ICD-10-CM

## 2023-04-28 LAB — LIPID PANEL
Cholesterol: 192 mg/dL (ref 0–200)
HDL: 71.1 mg/dL (ref >39.00)
LDL Cholesterol: 104 mg/dL — ABNORMAL HIGH (ref 0–99)
NonHDL: 121.03
Total CHOL/HDL Ratio: 3
Triglycerides: 87 mg/dL (ref 0.0–149.0)
VLDL: 17.4 mg/dL (ref 0.0–40.0)

## 2023-04-28 LAB — COMPREHENSIVE METABOLIC PANEL
ALT: 21 U/L (ref 0–53)
AST: 23 U/L (ref 0–37)
Albumin: 4.5 g/dL (ref 3.5–5.2)
Alkaline Phosphatase: 62 U/L (ref 39–117)
BUN: 21 mg/dL (ref 6–23)
CO2: 26 meq/L (ref 19–32)
Calcium: 9.9 mg/dL (ref 8.4–10.5)
Chloride: 100 meq/L (ref 96–112)
Creatinine, Ser: 1.34 mg/dL (ref 0.40–1.50)
GFR: 52.4 mL/min — ABNORMAL LOW (ref >60.00)
Glucose, Bld: 123 mg/dL — ABNORMAL HIGH (ref 70–99)
Potassium: 4.3 meq/L (ref 3.5–5.1)
Sodium: 134 meq/L — ABNORMAL LOW (ref 135–145)
Total Bilirubin: 0.5 mg/dL (ref 0.2–1.2)
Total Protein: 7.5 g/dL (ref 6.0–8.3)

## 2023-04-28 LAB — HEMOGLOBIN A1C: Hgb A1c MFr Bld: 6 % (ref 4.6–6.5)

## 2023-04-28 MED ORDER — ATORVASTATIN CALCIUM 40 MG PO TABS
40.0000 mg | ORAL_TABLET | Freq: Every day | ORAL | 1 refills | Status: DC
Start: 2023-04-28 — End: 2023-12-17
  Filled 2023-04-28 – 2023-06-12 (×2): qty 90, 90d supply, fill #0
  Filled 2023-09-17: qty 90, 90d supply, fill #1

## 2023-04-28 MED ORDER — EPINEPHRINE 0.3 MG/0.3ML IJ SOAJ
0.3000 mg | INTRAMUSCULAR | 5 refills | Status: AC | PRN
Start: 2023-04-28 — End: ?
  Filled 2023-04-28: qty 2, 30d supply, fill #0
  Filled 2023-05-21: qty 2, 2d supply, fill #0

## 2023-04-28 MED ORDER — LOSARTAN POTASSIUM-HCTZ 100-25 MG PO TABS
1.0000 | ORAL_TABLET | Freq: Every day | ORAL | 1 refills | Status: DC
Start: 2023-04-28 — End: 2023-12-17
  Filled 2023-04-28 – 2023-06-12 (×2): qty 90, 90d supply, fill #0
  Filled 2023-09-17: qty 90, 90d supply, fill #1

## 2023-04-28 NOTE — Patient Instructions (Signed)
Health Maintenance, Male Adopting a healthy lifestyle and getting preventive care are important in promoting health and wellness. Ask your health care provider about: The right schedule for you to have regular tests and exams. Things you can do on your own to prevent diseases and keep yourself healthy. What should I know about diet, weight, and exercise? Eat a healthy diet  Eat a diet that includes plenty of vegetables, fruits, low-fat dairy products, and lean protein. Do not eat a lot of foods that are high in solid fats, added sugars, or sodium. Maintain a healthy weight Body mass index (BMI) is a measurement that can be used to identify possible weight problems. It estimates body fat based on height and weight. Your health care provider can help determine your BMI and help you achieve or maintain a healthy weight. Get regular exercise Get regular exercise. This is one of the most important things you can do for your health. Most adults should: Exercise for at least 150 minutes each week. The exercise should increase your heart rate and make you sweat (moderate-intensity exercise). Do strengthening exercises at least twice a week. This is in addition to the moderate-intensity exercise. Spend less time sitting. Even light physical activity can be beneficial. Watch cholesterol and blood lipids Have your blood tested for lipids and cholesterol at 74 years of age, then have this test every 5 years. You may need to have your cholesterol levels checked more often if: Your lipid or cholesterol levels are high. You are older than 74 years of age. You are at high risk for heart disease. What should I know about cancer screening? Many types of cancers can be detected early and may often be prevented. Depending on your health history and family history, you may need to have cancer screening at various ages. This may include screening for: Colorectal cancer. Prostate cancer. Skin cancer. Lung  cancer. What should I know about heart disease, diabetes, and high blood pressure? Blood pressure and heart disease High blood pressure causes heart disease and increases the risk of stroke. This is more likely to develop in people who have high blood pressure readings or are overweight. Talk with your health care provider about your target blood pressure readings. Have your blood pressure checked: Every 3-5 years if you are 18-39 years of age. Every year if you are 40 years old or older. If you are between the ages of 65 and 75 and are a current or former smoker, ask your health care provider if you should have a one-time screening for abdominal aortic aneurysm (AAA). Diabetes Have regular diabetes screenings. This checks your fasting blood sugar level. Have the screening done: Once every three years after age 45 if you are at a normal weight and have a low risk for diabetes. More often and at a younger age if you are overweight or have a high risk for diabetes. What should I know about preventing infection? Hepatitis B If you have a higher risk for hepatitis B, you should be screened for this virus. Talk with your health care provider to find out if you are at risk for hepatitis B infection. Hepatitis C Blood testing is recommended for: Everyone born from 1945 through 1965. Anyone with known risk factors for hepatitis C. Sexually transmitted infections (STIs) You should be screened each year for STIs, including gonorrhea and chlamydia, if: You are sexually active and are younger than 74 years of age. You are older than 74 years of age and your   health care provider tells you that you are at risk for this type of infection. Your sexual activity has changed since you were last screened, and you are at increased risk for chlamydia or gonorrhea. Ask your health care provider if you are at risk. Ask your health care provider about whether you are at high risk for HIV. Your health care provider  may recommend a prescription medicine to help prevent HIV infection. If you choose to take medicine to prevent HIV, you should first get tested for HIV. You should then be tested every 3 months for as long as you are taking the medicine. Follow these instructions at home: Alcohol use Do not drink alcohol if your health care provider tells you not to drink. If you drink alcohol: Limit how much you have to 0-2 drinks a day. Know how much alcohol is in your drink. In the [place] one drink equals one 12 oz bottle of beer (355 mL), one 5 oz glass of wine (148 mL), or one 1 oz glass of hard liquor (44 mL). Lifestyle Do not use any products that contain nicotine or tobacco. These products include cigarettes, chewing tobacco, and vaping devices, such as e-cigarettes. If you need help quitting, ask your health care provider. Do not use street drugs. Do not share needles. Ask your health care provider for help if you need support or information about quitting drugs. General instructions Schedule regular health, dental, and eye exams. Stay current with your vaccines. Tell your health care provider if: You often feel depressed. You have ever been abused or do not feel safe at home. Summary Adopting a healthy lifestyle and getting preventive care are important in promoting health and wellness. Follow your health care provider's instructions about healthy diet, exercising, and getting tested or screened for diseases. Follow your health care provider's instructions on monitoring your cholesterol and blood pressure. This information is not intended to replace advice given to you by your health care provider. Make sure you discuss any questions you have with your health care provider. Document Revised: 10/16/2020 Document Reviewed: 10/16/2020 Elsevier Patient Education  2024 [employer] Inc[employer]  

## 2023-04-28 NOTE — Progress Notes (Signed)
Complete physical exam  Patient: Russell Cox   DOB: 1951-01-19   72 y.o. Male  MRN: [id number]  Subjective:    Chief Complaint  Patient presents with   Annual Exam    Russell Cox is a 72 y.o. male who presents today for a complete physical exam. He reports consuming a general diet. Home exercise routine includes getting daily physical activity, usually walking and yard work. He generally feels well. He reports sleeping well. He does not have additional problems to discuss today.    Most recent fall risk assessment:    04/28/2023    9:24 AM  Fall Risk   Falls in the past year? 0  Number falls in past yr: 0  Injury with Fall? 0  Risk for fall due to : No Fall Risks  Follow up Falls evaluation completed     Most recent depression screenings:    04/28/2023    9:24 AM 07/15/2022   10:51 AM  PHQ 2/9 Scores  PHQ - 2 Score 0 0  PHQ- 9 Score 0     Vision:Within last year and Dental: No current dental problems and Receives regular dental care  Patient Active Problem List   Diagnosis Date Noted   COPD (chronic obstructive pulmonary disease) (HCC) 04/26/2022   COVID-19 02/21/2022   Low back pain 04/05/2020   Impaired glucose tolerance 08/05/2014   History of colonic polyps 08/05/2014   Dyslipidemia 03/25/2011   Tobacco abuse 03/25/2011   Hypertension 12/05/2010      Patient Care Team: Karie Georges, MD as PCP - General (Family Medicine) Janalyn Harder, MD (Inactive) as Consulting Physician (Dermatology)   Outpatient Medications Prior to Visit  Medication Sig   aspirin 81 MG tablet Take 1 tablet (81 mg total) daily by mouth.   Multiple Vitamin (MULTIVITAMIN ADULT PO) Take by mouth.   [DISCONTINUED] atorvastatin (LIPITOR) 40 MG tablet TAKE 1 TABLET BY MOUTH ONCE DAILY   [DISCONTINUED] atorvastatin (LIPITOR) 40 MG tablet Take 1 tablet (40 mg total) by mouth daily.   [DISCONTINUED] EPINEPHrine 0.3 mg/0.3 mL IJ SOAJ injection Inject 0.3 mg into the muscle as needed for  anaphylaxis.   [DISCONTINUED] losartan-hydrochlorothiazide (HYZAAR) 100-25 MG tablet TAKE 1 TABLET BY MOUTH DAILY.   [DISCONTINUED] losartan-hydrochlorothiazide (HYZAAR) 100-25 MG tablet Take 1 tablet by mouth daily.   [DISCONTINUED] methylPREDNISolone (MEDROL DOSEPAK) 4 MG TBPK tablet Take as directed with food   No facility-administered medications prior to visit.    Review of Systems  HENT:  Negative for hearing loss.   Eyes:  Negative for blurred vision.  Respiratory:  Negative for shortness of breath.   Cardiovascular:  Negative for chest pain.  Gastrointestinal: Negative.   Genitourinary: Negative.   Musculoskeletal:  Negative for back pain.  Neurological:  Negative for headaches.  Psychiatric/Behavioral:  Negative for depression.   All other systems reviewed and are negative.      Objective:     BP (!) 142/84 (BP Location: Right Arm, Patient Position: Sitting, Cuff Size: Normal)   Pulse 85   Temp 97.9 F (36.6 C) (Oral)   Ht 5' 11.5" (1.816 m)   Wt 164 lb 6.4 oz (74.6 kg)   SpO2 97%   BMI 22.61 kg/m    Physical Exam Vitals reviewed.  Constitutional:      Appearance: Normal appearance. He is well-groomed and normal weight.  HENT:     Right Ear: Tympanic membrane and ear canal normal.     Left Ear: Tympanic  membrane and ear canal normal.     Mouth/Throat:     Mouth: Mucous membranes are moist.     Pharynx: No posterior oropharyngeal erythema.  Eyes:     Extraocular Movements: Extraocular movements intact.     Conjunctiva/sclera: Conjunctivae normal.  Neck:     Thyroid: No thyromegaly.  Cardiovascular:     Rate and Rhythm: Normal rate and regular rhythm.     Heart sounds: S1 normal and S2 normal. No murmur heard. Pulmonary:     Effort: Pulmonary effort is normal.     Breath sounds: Normal breath sounds and air entry. No rales.  Abdominal:     General: Abdomen is flat. Bowel sounds are normal.  Musculoskeletal:     Right lower leg: No edema.     Left  lower leg: No edema.  Lymphadenopathy:     Cervical: No cervical adenopathy.  Neurological:     General: No focal deficit present.     Mental Status: He is alert and oriented to person, place, and time.     Gait: Gait is intact.  Psychiatric:        Mood and Affect: Mood and affect normal.      No results found for any visits on 04/28/23.     Assessment & Plan:    Routine Health Maintenance and Physical Exam  Immunization History  Administered Date(s) Administered   Fluad Quad(high Dose 65+) 03/03/2019, 03/15/2020, 04/04/2021, 04/26/2022   Influenza Split 03/25/2011, 04/20/2012   Influenza Whole 05/28/2007   Influenza, High Dose Seasonal PF 04/15/2017   Influenza,inj,Quad PF,6+ Mos 04/06/2013   Influenza-Unspecified 03/10/2014, 04/21/2023   PFIZER(Purple Top)SARS-COV-2 Vaccination 07/23/2019, 08/14/2019, 04/11/2020   PNEUMOCOCCAL CONJUGATE-20 08/06/2021   Pfizer Covid-19 Vaccine Bivalent Booster 24yrs & up 04/04/2021   Pneumococcal Conjugate-13 08/05/2014   Pneumococcal Polysaccharide-23 04/15/2017   Tdap 03/25/2011   Zoster Recombinant(Shingrix) 03/17/2018, 07/27/2018, 12/08/2018    Health Maintenance  Topic Date Due   COVID-19 Vaccine (5 - 2023-24 season) 02/09/2023   Medicare Annual Wellness (AWV)  07/16/2023   Lung Cancer Screening  11/29/2023   Colonoscopy  08/20/2025   Pneumonia Vaccine 91+ Years old  Completed   INFLUENZA VACCINE  Completed   Hepatitis C Screening  Completed   Zoster Vaccines- Shingrix  Completed   HPV VACCINES  Aged Out   DTaP/Tdap/Td  Discontinued    Discussed health benefits of physical activity, and encouraged him to engage in regular exercise appropriate for his age and condition.  Impaired glucose tolerance -     Hemoglobin A1c  Allergy to bee sting -     EPINEPHrine; Inject 0.3 mg into the muscle as needed for anaphylaxis.  Dispense: 1 each; Refill: 5  Essential hypertension -     Losartan Potassium-HCTZ; Take 1 tablet by mouth  daily.  Dispense: 90 tablet; Refill: 1 -     Comprehensive metabolic panel  Dyslipidemia -     Lipid panel; Future -     Atorvastatin Calcium; Take 1 tablet (40 mg total) by mouth daily.  Dispense: 90 tablet; Refill: 1  Routine general medical examination at a health care facility  BP is slightly elevated today, pt states he smoked a cigarette before the visit. I counseled the patient on smoking cessation, pt reports he is planning on quitting after New Year's and would like a prescription for chanitx at that time. I offered verbal encouragement and we discussed the risks/benefits of chantix treatment. He will message me when he is ready to  start the medication. Handouts given on healthy eating and exercise.   Return in 1 year (on 04/27/2024).     Karie Georges, MD

## 2023-05-01 ENCOUNTER — Other Ambulatory Visit: Payer: Self-pay

## 2023-05-21 ENCOUNTER — Other Ambulatory Visit (HOSPITAL_COMMUNITY)
Admission: RE | Admit: 2023-05-21 | Discharge: 2023-05-21 | Disposition: A | Discharge status: Home / Self Care | Payer: PPO | Source: Ambulatory Visit | Attending: Medical Genetics | Admitting: Medical Genetics

## 2023-05-21 ENCOUNTER — Other Ambulatory Visit (HOSPITAL_COMMUNITY): Payer: Self-pay

## 2023-05-21 DIAGNOSIS — Z006 Encounter for examination for normal comparison and control in clinical research program: Secondary | ICD-10-CM | POA: Insufficient documentation

## 2023-05-31 LAB — GENECONNECT MOLECULAR SCREEN: Genetic Analysis Overall Interpretation: NEGATIVE

## 2023-06-12 ENCOUNTER — Other Ambulatory Visit (HOSPITAL_COMMUNITY): Payer: Self-pay

## 2023-06-12 ENCOUNTER — Other Ambulatory Visit: Payer: Self-pay

## 2023-06-13 DIAGNOSIS — H43812 Vitreous degeneration, left eye: Secondary | ICD-10-CM | POA: Diagnosis not present

## 2023-07-17 ENCOUNTER — Encounter: Payer: Self-pay | Admitting: Physician Assistant

## 2023-07-17 ENCOUNTER — Other Ambulatory Visit (HOSPITAL_COMMUNITY): Payer: Self-pay

## 2023-07-17 ENCOUNTER — Ambulatory Visit: Payer: PPO | Admitting: Physician Assistant

## 2023-07-17 DIAGNOSIS — G8929 Other chronic pain: Secondary | ICD-10-CM

## 2023-07-17 DIAGNOSIS — M545 Low back pain, unspecified: Secondary | ICD-10-CM

## 2023-07-17 MED ORDER — METHYLPREDNISOLONE 4 MG PO TBPK
ORAL_TABLET | ORAL | 0 refills | Status: DC
  Filled 2023-07-17: qty 21, 6d supply, fill #0

## 2023-07-17 NOTE — Progress Notes (Signed)
 Office Visit Note   Patient: Russell Cox           Date of Birth: 05-16-1951           MRN: [id number] Visit Date: 07/17/2023              Requested by: Ozell Heron HERO, MD [address] [place],  [place] [address] PCP: Ozell Heron HERO, MD  Chief Complaint  Patient presents with   Lower Back - Pain      HPI: Patient is a pleasant 73 year old gentleman previous patient of Dr. Anderson.  He has a history of low back arthritis.  Occasionally he gets a flareup usually from doing physical activity he gets very good relief with a steroid Dosepak.  Last Dosepak was 7 months ago.  Recently he was trying to lift something up and he feels a tug on his back denies any loss of bowel or bladder control says this is very much like previous episodes  Assessment & Plan: Visit Diagnoses: Low back pain  Plan: He has no weakness mostly pain focally over the lower back negative straight leg raise he does have pain with flexion and extension of his back no radicular findings today.  Will call him in a Medrol  Dosepak reminded him the cautions to take while taking this medication  Follow-Up Instructions: Return if symptoms worsen or fail to improve.   Ortho Exam  Patient is alert, oriented, no adenopathy, well-dressed, normal affect, normal respiratory effort. Examination of his low back no step-offs he is neurovascular intact he does have some stiffness with forward flexion and extension.  No radicular findings  Imaging: No results found. No images are attached to the encounter.  Labs: Lab Results  Component Value Date   HGBA1C 6.0 04/28/2023   HGBA1C 6.0 07/15/2022   HGBA1C 5.9 08/06/2021     Lab Results  Component Value Date   ALBUMIN 4.5 04/28/2023   ALBUMIN 4.4 07/15/2022   ALBUMIN 4.8 08/06/2021    No results found for: MG No results found for: VD25OH  No results found for: PREALBUMIN    Latest Ref Rng & Units 08/06/2021    9:51 AM 08/02/2020    2:23 PM  12/08/2019    3:38 PM  CBC EXTENDED  WBC 4.0 - 10.5 K/uL 6.1  6.7  6.1   RBC 4.22 - 5.81 Mil/uL 4.94  4.46  4.19   Hemoglobin 13.0 - 17.0 g/dL 84.0  85.0  85.9   HCT 39.0 - 52.0 % 48.3  43.1  41.0   Platelets 150.0 - 400.0 K/uL 192.0  208.0  194.0   NEUT# 1.4 - 7.7 K/uL 3.7  4.0  3.8   Lymph# 0.7 - 4.0 K/uL 1.4  1.8  1.4      There is no height or weight on file to calculate BMI.  Orders:  No orders of the defined types were placed in this encounter.  No orders of the defined types were placed in this encounter.    Procedures: No procedures performed  Clinical Data: No additional findings.  ROS:  All other systems negative, except as noted in the HPI. Review of Systems  Objective: Vital Signs: There were no vitals taken for this visit.  Specialty Comments:  No specialty comments available.  PMFS History: Patient Active Problem List   Diagnosis Date Noted   COPD (chronic obstructive pulmonary disease) (HCC) 04/26/2022   COVID-19 02/21/2022   Low back pain 04/05/2020   Impaired glucose  tolerance 08/05/2014   History of colonic polyps 08/05/2014   Dyslipidemia 03/25/2011   Tobacco abuse 03/25/2011   Hypertension 12/05/2010   Past Medical History:  Diagnosis Date   Allergy    seasonal   Arthritis    a little bit,back,elbows.   Asthma    as a child   Atypical mole 03/26/2010   R Mid Lower Back - WS   Basal cell carcinoma 03/10/2006   L Side Nose - MOHs   Basal cell carcinoma (BCC) 03/26/2010   L Inferior Nose - MOHs   Cataract    removed bilaterally   Cigarette smoker    COPD (chronic obstructive pulmonary disease) (HCC)    Hyperlipidemia    Hypertension    SINUSITIS, ACUTE MAXILLARY 02/24/2007   URI 05/28/2007    Family History  Problem Relation Age of Onset   Cancer Mother        unknown what type   Colon cancer Mother 21   Arthritis Mother    Heart disease Father    Cancer Father        thyroid    Hypertension Sister    Hypertension  Brother    Bladder Cancer Brother 74   Diabetes Maternal Grandmother    Cancer Maternal Grandfather        lung   Colon polyps Child    Esophageal cancer Neg Hx    Rectal cancer Neg Hx    Stomach cancer Neg Hx    Crohn's disease Neg Hx    Ulcerative colitis Neg Hx     Past Surgical History:  Procedure Laterality Date   CATARACT EXTRACTION W/ INTRAOCULAR LENS IMPLANT Bilateral    left   COLONOSCOPY  03/2016   repeat in 5 years   ELBOW SURGERY  1988   tennis   hemmorrhoidectomy  1988   POLYPECTOMY     SHOULDER ARTHROSCOPY W/ ACROMIAL REPAIR     left   Social History   Occupational History   Not on file  Tobacco Use   Smoking status: Every Day    Current packs/day: 1.00    Average packs/day: 1 pack/day for 54.2 years (54.2 ttl pk-yrs)    Types: Cigarettes    Start date: 38   Smokeless tobacco: Never   Tobacco comments:    DISCUSSED SMOKING CESSATION 07/10/2021  Vaping Use   Vaping status: Never Used  Substance and Sexual Activity   Alcohol use: Yes    Comment: socially   Drug use: No   Sexual activity: Not Currently

## 2023-08-01 ENCOUNTER — Ambulatory Visit: Payer: PPO

## 2023-08-01 VITALS — Ht 71.5 in | Wt 164.0 lb

## 2023-08-01 DIAGNOSIS — Z0000 Encounter for general adult medical examination without abnormal findings: Secondary | ICD-10-CM | POA: Diagnosis not present

## 2023-08-01 NOTE — Patient Instructions (Addendum)
Russell Cox , Thank you for taking time to come for your Medicare Wellness Visit. I appreciate your ongoing commitment to your health goals. Please review the following plan we discussed and let me know if I can assist you in the future.   Referrals/Orders/Follow-Ups/Clinician Recommendations:   This is a list of the screening recommended for you and due dates:  Health Maintenance  Topic Date Due   COVID-19 Vaccine (5 - 2024-25 season) 02/09/2023   Screening for Lung Cancer  11/29/2023   Medicare Annual Wellness Visit  07/31/2024   Colon Cancer Screening  08/20/2025   Pneumonia Vaccine  Completed   Flu Shot  Completed   Hepatitis C Screening  Completed   Zoster (Shingles) Vaccine  Completed   HPV Vaccine  Aged Out   DTaP/Tdap/Td vaccine  Discontinued    Advanced directives: (Copy Requested) Please bring a copy of your health care power of attorney and living will to the office to be added to your chart at your convenience.  Next Medicare Annual Wellness Visit scheduled for next year: Yes

## 2023-08-01 NOTE — Progress Notes (Signed)
Subjective:   Russell Cox is a 73 y.o. male who presents for Medicare Annual/Subsequent preventive examination.  Visit Complete: Virtual I connected with  Russell Cox on 08/01/23 by a audio enabled telemedicine application and verified that I am speaking with the correct person using two identifiers.  Patient Location: Home  Provider Location: Home Office  I discussed the limitations of evaluation and management by telemedicine. The patient expressed understanding and agreed to proceed.  Vital Signs: Because this visit was a virtual/telehealth visit, some criteria may be missing or patient reported. Any vitals not documented were not able to be obtained and vitals that have been documented are patient reported.    Cardiac Risk Factors include: advanced age (>44men, >26 women);male gender;hypertension;dyslipidemia     Objective:    Today's Vitals   08/01/23 1006  Weight: 164 lb (74.4 kg)  Height: 5' 11.5" (1.816 m)   Body mass index is 22.55 kg/m.     08/01/2023   10:13 AM 07/15/2022   10:53 AM 06/19/2021   11:30 AM 06/13/2020    2:43 PM 03/05/2016    3:23 PM  Advanced Directives  Does Patient Have a Medical Advance Directive? Yes Yes Yes Yes Yes  Type of Estate agent of Russell Cox;Living will Healthcare Power of [place];Living will Healthcare Power of [place];Living will Healthcare Power of [place];Living will Healthcare Power of Russell Cox;Living will  Copy of Healthcare Power of Attorney in Chart? No - copy requested No - copy requested No - copy requested No - copy requested     Current Medications (verified) Outpatient Encounter Medications as of 08/01/2023  Medication Sig   aspirin 81 MG tablet Take 1 tablet (81 mg total) daily by mouth.   atorvastatin (LIPITOR) 40 MG tablet Take 1 tablet (40 mg total) by mouth daily.   EPINEPHrine 0.3 mg/0.3 mL IJ SOAJ injection Inject 0.3 mg into the muscle as needed for anaphylaxis.   losartan-hydrochlorothiazide  (HYZAAR) 100-25 MG tablet Take 1 tablet by mouth daily.   methylPREDNISolone (MEDROL DOSEPAK) 4 MG TBPK tablet Take as directed   Multiple Vitamin (MULTIVITAMIN ADULT PO) Take by mouth.   No facility-administered encounter medications on file as of 08/01/2023.    Allergies (verified) Bee venom and Penicillins   History: Past Medical History:  Diagnosis Date   Allergy    seasonal   Arthritis    "a little bit,back,elbows".   Asthma    as a child   Atypical mole 03/26/2010   R Mid Lower Back - WS   Basal cell carcinoma 03/10/2006   L Side Nose - MOHs   Basal cell carcinoma (BCC) 03/26/2010   L Inferior Nose - MOHs   Cataract    removed bilaterally   Cigarette smoker    COPD (chronic obstructive pulmonary disease) (HCC)    Hyperlipidemia    Hypertension    SINUSITIS, ACUTE MAXILLARY 02/24/2007   URI 05/28/2007   Past Surgical History:  Procedure Laterality Date   CATARACT EXTRACTION W/ INTRAOCULAR LENS IMPLANT Bilateral    left   COLONOSCOPY  03/2016   repeat in 5 years   ELBOW SURGERY  1988   tennis   hemmorrhoidectomy  1988   POLYPECTOMY     SHOULDER ARTHROSCOPY W/ ACROMIAL REPAIR     left   Family History  Problem Relation Age of Onset   Cancer Mother        unknown what type   Colon cancer Mother 40   Arthritis Mother  Heart disease Father    Cancer Father        thyroid   Hypertension Sister    Hypertension Brother    Bladder Cancer Brother 59   Diabetes Maternal Grandmother    Cancer Maternal Grandfather        lung   Colon polyps Child    Esophageal cancer Neg Hx    Rectal cancer Neg Hx    Stomach cancer Neg Hx    Crohn's disease Neg Hx    Ulcerative colitis Neg Hx    Social History   Socioeconomic History   Marital status: Married    Spouse name: Not on file   Number of children: Not on file   Years of education: Not on file   Highest education level: Not on file  Occupational History   Not on file  Tobacco Use   Smoking status:  Every Day    Current packs/day: 1.00    Average packs/day: 1 pack/day for 54.2 years (54.2 ttl pk-yrs)    Types: Cigarettes    Start date: 4   Smokeless tobacco: Never   Tobacco comments:    DISCUSSED SMOKING CESSATION 07/10/2021  Vaping Use   Vaping status: Never Used  Substance and Sexual Activity   Alcohol use: Yes    Comment: socially   Drug use: No   Sexual activity: Not Currently  Other Topics Concern   Not on file  Social History Narrative   Not on file   Social Drivers of Health   Financial Resource Strain: Low Risk  (08/01/2023)   Overall Financial Resource Strain (CARDIA)    Difficulty of Paying Living Expenses: Not hard at all  Food Insecurity: No Food Insecurity (08/01/2023)   Hunger Vital Sign    Worried About Running Out of Food in the Last Year: Never true    Ran Out of Food in the Last Year: Never true  Transportation Needs: No Transportation Needs (08/01/2023)   PRAPARE - Administrator, Civil Service (Medical): No    Lack of Transportation (Non-Medical): No  Physical Activity: Insufficiently Active (08/01/2023)   Exercise Vital Sign    Days of Exercise per Week: 4 days    Minutes of Exercise per Session: 30 min  Stress: No Stress Concern Present (08/01/2023)   [employer] of Occupational Health - Occupational Stress Questionnaire    Feeling of Stress : Not at all  Social Connections: Moderately Isolated (08/01/2023)   Social Connection and Isolation Panel [NHANES]    Frequency of Communication with Friends and Family: More than three times a week    Frequency of Social Gatherings with Friends and Family: More than three times a week    Attends Religious Services: Never    Database administrator or Organizations: No    Attends Engineer, structural: Never    Marital Status: Married    Tobacco Counseling Ready to quit: No Counseling given: Yes Tobacco comments: DISCUSSED SMOKING CESSATION 07/10/2021   Clinical  Intake:  Pre-visit preparation completed: Yes  Pain : No/denies pain     BMI - recorded: 22.55 Nutritional Status: BMI of 19-24  Normal Nutritional Risks: None Diabetes: No  How often do you need to have someone help you when you read instructions, pamphlets, or other written materials from your doctor or pharmacy?: 1 - Never  Interpreter Needed?: No  Information entered by :: Theresa Mulligan LPN   Activities of Daily Living    08/01/2023   10:12  AM  In your present state of health, do you have any difficulty performing the following activities:  Hearing? 0  Vision? 0  Difficulty concentrating or making decisions? 0  Walking or climbing stairs? 0  Dressing or bathing? 0  Doing errands, shopping? 0  Preparing Food and eating ? N  Using the Toilet? N  In the past six months, have you accidently leaked urine? N  Do you have problems with loss of bowel control? N  Managing your Medications? N  Managing your Finances? N  Housekeeping or managing your Housekeeping? N    Patient Care Team: Karie Georges, MD as PCP - General (Family Medicine) Janalyn Harder, MD (Inactive) as Consulting Physician (Dermatology)  Indicate any recent Medical Services you may have received from other than [facility] providers in the past year (date may be approximate).     Assessment:   This is a routine wellness examination for [facility]  Hearing/Vision screen Hearing Screening - Comments:: Denies hearing difficulties   Vision Screening - Comments:: Wears rx glasses - up to date with routine eye exams with  [facility]   Goals Addressed               This Visit's Progress     Stay Active (pt-stated)         Depression Screen    08/01/2023   10:11 AM 04/28/2023    9:24 AM 07/15/2022   10:51 AM 08/06/2021    9:50 AM 06/19/2021   11:31 AM 08/02/2020    1:25 PM 06/13/2020    2:42 PM  PHQ 2/9 Scores  PHQ - 2 Score 0 0 0 0 0 0 0  PHQ- 9 Score  0  0       Fall Risk    08/01/2023    10:12 AM 04/28/2023    9:24 AM 07/15/2022   10:52 AM 07/14/2022   12:08 PM 08/06/2021    8:53 AM  Fall Risk   Falls in the past year? 0 0 0 0 0  Number falls in past yr: 0 0 0  0  Injury with Fall? 0 0 0 0 0  Risk for fall due to : No Fall Risks No Fall Risks No Fall Risks  Other (Comment)  Follow up Falls prevention discussed;Falls evaluation completed Falls evaluation completed Falls prevention discussed  Falls evaluation completed    MEDICARE RISK AT HOME: Medicare Risk at Home Any stairs in or around the home?: Yes If so, are there any without handrails?: No Home free of loose throw rugs in walkways, pet beds, electrical cords, etc?: Yes Adequate lighting in your home to reduce risk of falls?: Yes Life alert?: No Use of a cane, walker or w/c?: No Grab bars in the bathroom?: No Shower chair or bench in shower?: Yes Elevated toilet seat or a handicapped toilet?: No  TIMED UP AND GO:  Was the test performed?  No    Cognitive Function:        08/01/2023   10:13 AM 07/15/2022   10:53 AM 06/19/2021   11:38 AM 06/13/2020    2:46 PM  6CIT Screen  What Year? 0 points 0 points 0 points 0 points  What month? 0 points 0 points 0 points 0 points  What time? 0 points 0 points 0 points   Count back from 20 0 points 0 points 0 points 0 points  Months in reverse 0 points 0 points 0 points 0 points  Repeat phrase 0  points 0 points 0 points 0 points  Total Score 0 points 0 points 0 points     Immunizations Immunization History  Administered Date(s) Administered   Fluad Quad(high Dose 65+) 03/03/2019, 03/15/2020, 04/04/2021, 04/26/2022   Influenza Split 03/25/2011, 04/20/2012   Influenza Whole 05/28/2007   Influenza, High Dose Seasonal PF 04/15/2017   Influenza,inj,Quad PF,6+ Mos 04/06/2013   Influenza-Unspecified 03/10/2014, 04/21/2023   PFIZER(Purple Top)SARS-COV-2 Vaccination 07/23/2019, 08/14/2019, 04/11/2020   PNEUMOCOCCAL CONJUGATE-20 08/06/2021   Pfizer Covid-19 Vaccine  Bivalent Booster 57yrs & up 04/04/2021   Pneumococcal Conjugate-13 08/05/2014   Pneumococcal Polysaccharide-23 04/15/2017   Tdap 03/25/2011   Zoster Recombinant(Shingrix) 03/17/2018, 07/27/2018, 12/08/2018      Flu Vaccine status: Up to date  Pneumococcal vaccine status: Up to date  Covid-19 vaccine status: Declined, Education has been provided regarding the importance of this vaccine but patient still declined. Advised may receive this vaccine at local pharmacy or Health Dept.or vaccine clinic. Aware to provide a copy of the vaccination record if obtained from local pharmacy or Health Dept. Verbalized acceptance and understanding.  Qualifies for Shingles Vaccine? Yes   Zostavax completed Yes   Shingrix Completed?: Yes  Screening Tests Health Maintenance  Topic Date Due   COVID-19 Vaccine (5 - 2024-25 season) 02/09/2023   Lung Cancer Screening  11/29/2023   Medicare Annual Wellness (AWV)  07/31/2024   Colonoscopy  08/20/2025   Pneumonia Vaccine 26+ Years old  Completed   INFLUENZA VACCINE  Completed   Hepatitis C Screening  Completed   Zoster Vaccines- Shingrix  Completed   HPV VACCINES  Aged Out   DTaP/Tdap/Td  Discontinued    Health Maintenance  Health Maintenance Due  Topic Date Due   COVID-19 Vaccine (5 - 2024-25 season) 02/09/2023    Colorectal cancer screening: Type of screening: Colonoscopy. Completed 08/21/22. Repeat every 3 years    Additional Screening:  Hepatitis C Screening: does qualify; Completed 04/15/17  Vision Screening: Recommended annual ophthalmology exams for early detection of glaucoma and other disorders of the eye. Is the patient up to date with their annual eye exam?  Yes  Who is the provider or what is the name of the office in which the patient attends annual eye exams? [facility] If pt is not established with a provider, would they like to be referred to a provider to establish care? No .   Dental Screening: Recommended annual dental  exams for proper oral hygiene    Community Resource Referral / Chronic Care Management:  CRR required this visit?  No   CCM required this visit?  No     Plan:     I have personally reviewed and noted the following in the patient's chart:   Medical and social history Use of alcohol, tobacco or illicit drugs  Current medications and supplements including opioid prescriptions. Patient is not currently taking opioid prescriptions. Functional ability and status Nutritional status Physical activity Advanced directives List of other physicians Hospitalizations, surgeries, and ER visits in previous 12 months Vitals Screenings to include cognitive, depression, and falls Referrals and appointments  In addition, I have reviewed and discussed with patient certain preventive protocols, quality metrics, and best practice recommendations. A written personalized care plan for preventive services as well as general preventive health recommendations were provided to patient.     Tillie Rung, LPN   [id number]   After Visit Summary: (MyChart) Due to this being a telephonic visit, the after visit summary with patients personalized plan was offered  to patient via MyChart   Nurse Notes: None

## 2023-08-13 DIAGNOSIS — Z85828 Personal history of other malignant neoplasm of skin: Secondary | ICD-10-CM | POA: Diagnosis not present

## 2023-08-13 DIAGNOSIS — L821 Other seborrheic keratosis: Secondary | ICD-10-CM | POA: Diagnosis not present

## 2023-08-13 DIAGNOSIS — L814 Other melanin hyperpigmentation: Secondary | ICD-10-CM | POA: Diagnosis not present

## 2023-08-13 DIAGNOSIS — D225 Melanocytic nevi of trunk: Secondary | ICD-10-CM | POA: Diagnosis not present

## 2023-08-13 DIAGNOSIS — Z08 Encounter for follow-up examination after completed treatment for malignant neoplasm: Secondary | ICD-10-CM | POA: Diagnosis not present

## 2023-09-09 ENCOUNTER — Ambulatory Visit (INDEPENDENT_AMBULATORY_CARE_PROVIDER_SITE_OTHER): Admitting: Family Medicine

## 2023-09-09 ENCOUNTER — Other Ambulatory Visit: Payer: Self-pay

## 2023-09-09 ENCOUNTER — Other Ambulatory Visit (HOSPITAL_COMMUNITY): Payer: Self-pay

## 2023-09-09 VITALS — BP 136/82 | HR 89 | Temp 98.5°F | Ht 71.5 in | Wt 163.1 lb

## 2023-09-09 DIAGNOSIS — J441 Chronic obstructive pulmonary disease with (acute) exacerbation: Secondary | ICD-10-CM

## 2023-09-09 MED ORDER — PREDNISONE 20 MG PO TABS
ORAL_TABLET | ORAL | 0 refills | Status: DC
Start: 2023-09-09 — End: 2023-10-13
  Filled 2023-09-09 (×2): qty 10, 5d supply, fill #0

## 2023-09-09 MED ORDER — DOXYCYCLINE HYCLATE 100 MG PO CAPS
100.0000 mg | ORAL_CAPSULE | Freq: Two times a day (BID) | ORAL | 0 refills | Status: DC
  Filled 2023-09-09 (×2): qty 14, 7d supply, fill #0

## 2023-09-09 NOTE — Progress Notes (Signed)
 Established Patient Office Visit  Subjective   Patient ID: Russell Cox, male    DOB: [birth year]  Age: 73 y.o. MRN: [id number]  Chief Complaint  Patient presents with   Cough    Cough and nasal congestion, with yellow-mucus, started 3 weeks ago    HPI   Mr Curenton is seen with almost 3-week history of productive cough and thick yellow nasal mucus.  He is an ex-smoker.  Does have reported COPD.  No chronic inhaler use.  Thinks he may be wheezing some at night intermittently.  Last week was out in [place] and had some dyspnea which he relates to the altitude.  He did report 1 day of fever near the onset up but none since then.  No significant headaches.  No significant facial pain.  Allergy to penicillin.  Nasal congestion symptoms seem to be consistently worse at night  Past Medical History:  Diagnosis Date   Allergy    seasonal   Arthritis    "a little bit,back,elbows".   Asthma    as a child   Atypical mole 03/26/2010   R Mid Lower Back - WS   Basal cell carcinoma 03/10/2006   L Side Nose - MOHs   Basal cell carcinoma (BCC) 03/26/2010   L Inferior Nose - MOHs   Cataract    removed bilaterally   Cigarette smoker    COPD (chronic obstructive pulmonary disease) (HCC)    Hyperlipidemia    Hypertension    SINUSITIS, ACUTE MAXILLARY 02/24/2007   URI 05/28/2007   Past Surgical History:  Procedure Laterality Date   CATARACT EXTRACTION W/ INTRAOCULAR LENS IMPLANT Bilateral    left   COLONOSCOPY  03/2016   repeat in 5 years   ELBOW SURGERY  1988   tennis   hemmorrhoidectomy  1988   POLYPECTOMY     SHOULDER ARTHROSCOPY W/ ACROMIAL REPAIR     left    reports that he has been smoking cigarettes. He started smoking about 50 years ago. He has a 54.3 pack-year smoking history. He has never used smokeless tobacco. He reports current alcohol use. He reports that he does not use drugs. family history includes Arthritis in his mother; Bladder Cancer (age of onset: 58) in his  brother; Cancer in his father, maternal grandfather, and mother; Colon cancer (age of onset: 65) in his mother; Colon polyps in his child; Diabetes in his maternal grandmother; Heart disease in his father; Hypertension in his brother and sister. Allergies  Allergen Reactions   Bee Venom Anaphylaxis   Penicillins Hives    Review of Systems  Constitutional:  Negative for chills and fever.  HENT:  Positive for congestion. Negative for sore throat.   Respiratory:  Positive for cough, sputum production and wheezing. Negative for hemoptysis.       Objective:     BP 136/82 (BP Location: Left Arm, Patient Position: Sitting, Cuff Size: Normal)   Pulse 89   Temp 98.5 F (36.9 C) (Oral)   Ht 5' 11.5" (1.816 m)   Wt 163 lb 1.6 oz (74 kg)   SpO2 95%   BMI 22.43 kg/m  BP Readings from Last 3 Encounters:  09/09/23 136/82  04/28/23 (!) 142/84  08/21/22 107/68   Wt Readings from Last 3 Encounters:  09/09/23 163 lb 1.6 oz (74 kg)  08/01/23 164 lb (74.4 kg)  04/28/23 164 lb 6.4 oz (74.6 kg)      Physical Exam Vitals reviewed.  Constitutional:  General: He is not in acute distress.    Appearance: He is not ill-appearing.  Cardiovascular:     Rate and Rhythm: Normal rate and regular rhythm.  Pulmonary:     Comments: Pulse oximetry 95% room air Musculoskeletal:     Right lower leg: No edema.     Left lower leg: No edema.  Neurological:     Mental Status: He is alert.      No results found for any visits on 09/09/23.    The 10-year ASCVD risk score (Arnett DK, et al., 2019) is: 28.4%    Assessment & Plan:   Acute exacerbation of COPD.  Patient in no respiratory distress.  Does not have any red flags such as fever or significant hypoxia.  Symptoms going on now 3 weeks.  He does describe some wheezing at night, though none noted at this time.  -Start doxycycline 100 mg twice daily for 7 days -Prednisone 20 mg 2 tablets daily for 5 days -Follow-up promptly for any  recurrent fever or increased shortness of breath -Has used albuterol in the past but did not feel like this was particularly helpful Evelena Peat, MD

## 2023-09-17 ENCOUNTER — Other Ambulatory Visit (HOSPITAL_COMMUNITY): Payer: Self-pay

## 2023-10-13 ENCOUNTER — Ambulatory Visit (INDEPENDENT_AMBULATORY_CARE_PROVIDER_SITE_OTHER): Admitting: Family Medicine

## 2023-10-13 ENCOUNTER — Other Ambulatory Visit (HOSPITAL_COMMUNITY): Payer: Self-pay

## 2023-10-13 ENCOUNTER — Encounter: Payer: Self-pay | Admitting: Family Medicine

## 2023-10-13 VITALS — BP 142/80 | HR 85 | Temp 97.8°F | Wt 163.4 lb

## 2023-10-13 DIAGNOSIS — J441 Chronic obstructive pulmonary disease with (acute) exacerbation: Secondary | ICD-10-CM | POA: Diagnosis not present

## 2023-10-13 MED ORDER — PREDNISONE 20 MG PO TABS
ORAL_TABLET | ORAL | 0 refills | Status: DC
  Filled 2023-10-13: qty 12, fill #0
  Filled 2023-10-13: qty 12, 6d supply, fill #0

## 2023-10-13 MED ORDER — CEFDINIR 300 MG PO CAPS
300.0000 mg | ORAL_CAPSULE | Freq: Two times a day (BID) | ORAL | 0 refills | Status: DC
  Filled 2023-10-13 (×2): qty 14, 7d supply, fill #0

## 2023-10-13 NOTE — Progress Notes (Signed)
 Established Patient Office Visit  Subjective   Patient ID: Russell Cox, male    DOB: [birth year]  Age: 73 y.o. MRN: [id number]  Chief Complaint  Patient presents with   Cough    Patient complains of productive cough, x3 weeks, Reports yellow sputum     HPI   Russell Cox is seen with some recurrent productive cough and nasal congestion.  Was seen here 1 April with acute exacerbation of COPD.  Was treated that time with doxycycline  and prednisone  and did improve some but wonders if Russell Cox got over his last exacerbation completely.  Has had some productive cough with yellow sputum for over a week.  No fever.  Intermittent wheezing.  Has had some nasal congestion and took Flonase last night but had nasal bleed after just 1 dose.  Has tried some Robitussin over-the-counter with minimal relief.  Still smokes about a pack of cigarettes per day.  No recent mopped assist.  Appetite and weight stable.  Russell Cox states his son is getting married in [place] in about a week and is hoping to be clear for that.  Denies any facial pain or headaches.  Russell Cox does do yearly low-dose CT lung cancer screening with last screen in June.  Previous scan showed diffuse bronchial wall thickening with mild central lobar and paraseptal emphysema.  Findings suggested underlying COPD.  Past Medical History:  Diagnosis Date   Allergy    seasonal   Arthritis    "a little bit,back,elbows".   Asthma    as a child   Atypical mole 03/26/2010   R Mid Lower Back - WS   Basal cell carcinoma 03/10/2006   L Side Nose - MOHs   Basal cell carcinoma (BCC) 03/26/2010   L Inferior Nose - MOHs   Cataract    removed bilaterally   Cigarette smoker    COPD (chronic obstructive pulmonary disease) (HCC)    Hyperlipidemia    Hypertension    SINUSITIS, ACUTE MAXILLARY 02/24/2007   URI 05/28/2007   Past Surgical History:  Procedure Laterality Date   CATARACT EXTRACTION W/ INTRAOCULAR LENS IMPLANT Bilateral    left   COLONOSCOPY  03/2016    repeat in 5 years   ELBOW SURGERY  1988   tennis   hemmorrhoidectomy  1988   POLYPECTOMY     SHOULDER ARTHROSCOPY W/ ACROMIAL REPAIR     left    reports that Russell Cox has been smoking cigarettes. Russell Cox started smoking about 50 years ago. Russell Cox has a 54.4 pack-year smoking history. Russell Cox has never used smokeless tobacco. Russell Cox reports current alcohol use. Russell Cox reports that Russell Cox does not use drugs. family history includes Arthritis in his mother; Bladder Cancer (age of onset: 57) in his brother; Cancer in his father, maternal grandfather, and mother; Colon cancer (age of onset: 75) in his mother; Colon polyps in his child; Diabetes in his maternal grandmother; Heart disease in his father; Hypertension in his brother and sister. Allergies  Allergen Reactions   Bee Venom Anaphylaxis   Penicillins Hives    Review of Systems  Constitutional:  Negative for chills and fever.  HENT:  Positive for congestion. Negative for sinus pain.   Respiratory:  Positive for cough, sputum production and wheezing. Negative for hemoptysis.   Cardiovascular:  Negative for chest pain.      Objective:     BP (!) 142/80 (BP Location: Left Arm, Cuff Size: Normal)   Pulse 85   Temp 97.8 F (36.6 C) (Oral)  Wt 163 lb 6.4 oz (74.1 kg)   SpO2 96%   BMI 22.47 kg/m  BP Readings from Last 3 Encounters:  10/13/23 (!) 142/80  09/09/23 136/82  04/28/23 (!) 142/84   Wt Readings from Last 3 Encounters:  10/13/23 163 lb 6.4 oz (74.1 kg)  09/09/23 163 lb 1.6 oz (74 kg)  08/01/23 164 lb (74.4 kg)      Physical Exam Vitals reviewed.  Constitutional:      General: Russell Cox is not in acute distress.    Appearance: Russell Cox is not ill-appearing.  Cardiovascular:     Rate and Rhythm: Normal rate and regular rhythm.  Pulmonary:     Effort: Pulmonary effort is normal.     Breath sounds: Normal breath sounds. No wheezing or rales.  Musculoskeletal:     Right lower leg: No edema.     Left lower leg: No edema.  Neurological:     Mental  Status: Russell Cox is alert.      No results found for any visits on 10/13/23.    The 10-year ASCVD risk score (Arnett DK, et al., 2019) is: 30.4%    Assessment & Plan:   Productive cough.  Suspect acute exacerbation of COPD.  Patient had recent similar exacerbation but did improve following antibiotics until about a week ago.  Russell Cox does not have any fever.  No rales.  No respiratory distress.  O2 sat 96% room air.  Russell Cox does folic Russell Cox is having some intermittent wheezing though none noted on exam at this time.  -Start Omnicef 300 mg 1 twice daily for 7 days - Prednisone  20 mg twice daily for 6 days - Follow-up with primary if symptoms persist.  May benefit from PFTs to further classify his chronic lung disease and possibly guide inhaler use  Russell Lamy, MD

## 2023-11-07 ENCOUNTER — Ambulatory Visit (INDEPENDENT_AMBULATORY_CARE_PROVIDER_SITE_OTHER): Admitting: Nurse Practitioner

## 2023-11-07 ENCOUNTER — Other Ambulatory Visit (HOSPITAL_COMMUNITY): Payer: Self-pay

## 2023-11-07 ENCOUNTER — Encounter: Payer: Self-pay | Admitting: Nurse Practitioner

## 2023-11-07 ENCOUNTER — Ambulatory Visit: Admitting: Family Medicine

## 2023-11-07 ENCOUNTER — Other Ambulatory Visit: Payer: Self-pay

## 2023-11-07 VITALS — BP 138/72 | HR 84 | Temp 96.8°F | Ht 71.0 in | Wt 161.2 lb

## 2023-11-07 DIAGNOSIS — J0140 Acute pansinusitis, unspecified: Secondary | ICD-10-CM

## 2023-11-07 MED ORDER — FLUTICASONE PROPIONATE 50 MCG/ACT NA SUSP
1.0000 | Freq: Two times a day (BID) | NASAL | 2 refills | Status: AC
  Filled 2023-11-07: qty 16, 30d supply, fill #0

## 2023-11-07 MED ORDER — AZELASTINE HCL 0.1 % NA SOLN
1.0000 | Freq: Two times a day (BID) | NASAL | 2 refills | Status: DC
  Filled 2023-11-07: qty 30, 100d supply, fill #0

## 2023-11-07 MED ORDER — SALINE SPRAY 0.65 % NA SOLN: 1.0000 | NASAL | Status: AC | PRN

## 2023-11-07 MED ORDER — CETIRIZINE HCL 10 MG PO TABS
10.0000 mg | ORAL_TABLET | Freq: Every day | ORAL | 0 refills | Status: DC
  Filled 2023-11-07: qty 30, 30d supply, fill #0

## 2023-11-07 MED ORDER — GUAIFENESIN ER 600 MG PO TB12: 600.0000 mg | ORAL_TABLET | Freq: Two times a day (BID) | ORAL | Status: AC | PRN

## 2023-11-07 NOTE — Patient Instructions (Addendum)
 Mucinex  and saline nasal spray is found OVER THE COUNTER. I sent flonase and zyrtec to your pharmacy. Stop tobacco use I entered ENT referral

## 2023-11-07 NOTE — Progress Notes (Signed)
 Acute Office Visit  Subjective:    Patient ID: Russell Cox, male    DOB: 06/30/1950, 73 y.o.   MRN: [id number]  Chief Complaint  Patient presents with   Cough    Cough and stuffy nose has been going on since the end of march was put on antibiotics but didn't really work    HPI Patient is in today for Sinus congestion since March (clear to yellow) associated with cough (occasional productive), Worse when lays on left side. Previously Treated with doxycline and prednisone  09/09/2023 x 7days, then with omnicef  and prednisone  10/11/2023 x 6days. Symptoms resolved x 2weeks, then returned. He denies any GERD symptoms, Nosebleed with OVER THE COUNTER nose spray (unknown name) and oral decongestant. Current tobacco use.  Outpatient Medications Prior to Visit  Medication Sig   aspirin  81 MG tablet Take 1 tablet (81 mg total) daily by mouth.   atorvastatin  (LIPITOR) 40 MG tablet Take 1 tablet (40 mg total) by mouth daily.   EPINEPHrine  0.3 mg/0.3 mL IJ SOAJ injection Inject 0.3 mg into the muscle as needed for anaphylaxis.   losartan -hydrochlorothiazide  (HYZAAR ) 100-25 MG tablet Take 1 tablet by mouth daily.   Multiple Vitamin (MULTIVITAMIN ADULT PO) Take by mouth.   cefdinir  (OMNICEF ) 300 MG capsule Take 1 capsule (300 mg total) by mouth 2 (two) times daily. (Patient not taking: Reported on 11/07/2023)   doxycycline  (VIBRAMYCIN ) 100 MG capsule Take 1 capsule (100 mg total) by mouth 2 (two) times daily. (Patient not taking: Reported on 11/07/2023)   predniSONE  (DELTASONE ) 20 MG tablet Take two tablets by mouth once daily for 6 days (Patient not taking: Reported on 11/07/2023)   No facility-administered medications prior to visit.    Reviewed past medical and social history.  Review of Systems Per HPI     Objective:    Physical Exam Vitals and nursing note reviewed.  Constitutional:      General: He is not in acute distress.    Appearance: He is not ill-appearing.  HENT:     Right Ear:  Tympanic membrane, ear canal and external ear normal.     Left Ear: Tympanic membrane, ear canal and external ear normal.     Nose: Septal deviation, mucosal edema and congestion present. No nasal tenderness or rhinorrhea.     Right Nostril: Occlusion present. No foreign body, epistaxis or septal hematoma.     Left Nostril: No foreign body, epistaxis, septal hematoma or occlusion.     Right Turbinates: Swollen. Not enlarged.     Left Turbinates: Swollen. Not enlarged.     Right Sinus: No maxillary sinus tenderness or frontal sinus tenderness.     Left Sinus: No maxillary sinus tenderness or frontal sinus tenderness.  Pulmonary:     Effort: Pulmonary effort is normal. No respiratory distress.     Breath sounds: Normal breath sounds.  Neurological:     Mental Status: He is alert.    BP 138/72 (BP Location: Left Arm, Patient Position: Sitting, Cuff Size: Normal)   Pulse 84   Temp (!) 96.8 F (36 C) (Temporal)   Ht 5\' 11"  (1.803 m)   Wt 161 lb 3.2 oz (73.1 kg)   SpO2 99%   BMI 22.48 kg/m    No results found for any visits on 11/07/23.     Assessment & Plan:   Problem List Items Addressed This Visit   None Visit Diagnoses       Subacute pansinusitis    -  Primary  Relevant Medications   cetirizine (ZYRTEC) 10 MG tablet   sodium chloride  (OCEAN) 0.65 % SOLN nasal spray   guaiFENesin  (MUCINEX ) 600 MG 12 hr tablet   fluticasone (FLONASE) 50 MCG/ACT nasal spray   Other Relevant Orders   Ambulatory referral to ENT      Meds ordered this encounter  Medications   DISCONTD: azelastine (ASTELIN) 0.1 % nasal spray    Sig: Place 1 spray into both nostrils 2 (two) times daily. Use in each nostril as directed    Dispense:  30 mL    Refill:  2    Supervising Provider:   Christianna Cowman ALFRED [[id number]]   cetirizine (ZYRTEC) 10 MG tablet    Sig: Take 1 tablet (10 mg total) by mouth at bedtime.    Dispense:  30 tablet    Refill:  0    Supervising Provider:   KREMER, WILLIAM ALFRED  [[id number]]   sodium chloride  (OCEAN) 0.65 % SOLN nasal spray    Sig: Place 1 spray into both nostrils as needed for congestion.    Supervising Provider:   Christianna Cowman ALFRED [[id number]]   guaiFENesin  (MUCINEX ) 600 MG 12 hr tablet    Sig: Take 1 tablet (600 mg total) by mouth 2 (two) times daily as needed for cough or to loosen phlegm.    Supervising Provider:   Christianna Cowman ALFRED [[id number]]   fluticasone (FLONASE) 50 MCG/ACT nasal spray    Sig: Place 1 spray into both nostrils 2 (two) times daily.    Dispense:  16 g    Refill:  2    D/c azelastine    Supervising Provider:   Christianna Cowman ALFRED [[id number]]   Advised to quit tobacco use.  Return if symptoms worsen or fail to improve.  Kathrene Parents, NP

## 2023-11-10 ENCOUNTER — Other Ambulatory Visit: Payer: Self-pay

## 2023-11-13 ENCOUNTER — Other Ambulatory Visit: Payer: Self-pay

## 2023-11-25 ENCOUNTER — Other Ambulatory Visit: Payer: Self-pay | Admitting: Acute Care

## 2023-11-25 DIAGNOSIS — F17210 Nicotine dependence, cigarettes, uncomplicated: Secondary | ICD-10-CM

## 2023-11-25 DIAGNOSIS — Z87891 Personal history of nicotine dependence: Secondary | ICD-10-CM

## 2023-12-09 ENCOUNTER — Ambulatory Visit (HOSPITAL_COMMUNITY)
Admission: RE | Admit: 2023-12-09 | Discharge: 2023-12-09 | Disposition: A | Discharge status: Home / Self Care | Source: Ambulatory Visit | Attending: Acute Care | Admitting: Acute Care

## 2023-12-09 DIAGNOSIS — Z87891 Personal history of nicotine dependence: Secondary | ICD-10-CM | POA: Diagnosis not present

## 2023-12-09 DIAGNOSIS — F17210 Nicotine dependence, cigarettes, uncomplicated: Secondary | ICD-10-CM | POA: Diagnosis not present

## 2023-12-17 ENCOUNTER — Other Ambulatory Visit: Payer: Self-pay

## 2023-12-17 ENCOUNTER — Other Ambulatory Visit (HOSPITAL_COMMUNITY): Payer: Self-pay

## 2023-12-17 ENCOUNTER — Other Ambulatory Visit: Payer: Self-pay | Admitting: Family Medicine

## 2023-12-17 DIAGNOSIS — F17210 Nicotine dependence, cigarettes, uncomplicated: Secondary | ICD-10-CM

## 2023-12-17 DIAGNOSIS — E785 Hyperlipidemia, unspecified: Secondary | ICD-10-CM

## 2023-12-17 DIAGNOSIS — I10 Essential (primary) hypertension: Secondary | ICD-10-CM

## 2023-12-17 DIAGNOSIS — Z122 Encounter for screening for malignant neoplasm of respiratory organs: Secondary | ICD-10-CM

## 2023-12-17 DIAGNOSIS — Z87891 Personal history of nicotine dependence: Secondary | ICD-10-CM

## 2023-12-17 MED ORDER — ATORVASTATIN CALCIUM 40 MG PO TABS
40.0000 mg | ORAL_TABLET | Freq: Every day | ORAL | 1 refills | Status: DC
  Filled 2023-12-17: qty 90, 90d supply, fill #0
  Filled 2024-03-18: qty 90, 90d supply, fill #1

## 2023-12-17 MED ORDER — LOSARTAN POTASSIUM-HCTZ 100-25 MG PO TABS
1.0000 | ORAL_TABLET | Freq: Every day | ORAL | 1 refills | Status: DC
  Filled 2023-12-17: qty 90, 90d supply, fill #0
  Filled 2024-03-18: qty 90, 90d supply, fill #1

## 2024-01-06 DIAGNOSIS — L2989 Other pruritus: Secondary | ICD-10-CM | POA: Diagnosis not present

## 2024-01-06 DIAGNOSIS — L738 Other specified follicular disorders: Secondary | ICD-10-CM | POA: Diagnosis not present

## 2024-01-06 DIAGNOSIS — L820 Inflamed seborrheic keratosis: Secondary | ICD-10-CM | POA: Diagnosis not present

## 2024-01-06 DIAGNOSIS — L538 Other specified erythematous conditions: Secondary | ICD-10-CM | POA: Diagnosis not present

## 2024-01-06 DIAGNOSIS — R208 Other disturbances of skin sensation: Secondary | ICD-10-CM | POA: Diagnosis not present

## 2024-01-06 DIAGNOSIS — Z789 Other specified health status: Secondary | ICD-10-CM | POA: Diagnosis not present

## 2024-01-26 DIAGNOSIS — L814 Other melanin hyperpigmentation: Secondary | ICD-10-CM | POA: Diagnosis not present

## 2024-01-26 DIAGNOSIS — D1801 Hemangioma of skin and subcutaneous tissue: Secondary | ICD-10-CM | POA: Diagnosis not present

## 2024-01-26 DIAGNOSIS — L821 Other seborrheic keratosis: Secondary | ICD-10-CM | POA: Diagnosis not present

## 2024-01-26 DIAGNOSIS — D485 Neoplasm of uncertain behavior of skin: Secondary | ICD-10-CM | POA: Diagnosis not present

## 2024-01-26 DIAGNOSIS — Z08 Encounter for follow-up examination after completed treatment for malignant neoplasm: Secondary | ICD-10-CM | POA: Diagnosis not present

## 2024-01-26 DIAGNOSIS — Z85828 Personal history of other malignant neoplasm of skin: Secondary | ICD-10-CM | POA: Diagnosis not present

## 2024-03-18 ENCOUNTER — Other Ambulatory Visit: Payer: Self-pay

## 2024-04-08 ENCOUNTER — Other Ambulatory Visit: Payer: Self-pay

## 2024-04-08 ENCOUNTER — Ambulatory Visit: Admitting: Physician Assistant

## 2024-04-08 ENCOUNTER — Other Ambulatory Visit (INDEPENDENT_AMBULATORY_CARE_PROVIDER_SITE_OTHER): Payer: Self-pay

## 2024-04-08 ENCOUNTER — Encounter: Payer: Self-pay | Admitting: Pharmacist

## 2024-04-08 ENCOUNTER — Encounter: Payer: Self-pay | Admitting: Physician Assistant

## 2024-04-08 DIAGNOSIS — M25511 Pain in right shoulder: Secondary | ICD-10-CM | POA: Diagnosis not present

## 2024-04-08 MED ORDER — METHYLPREDNISOLONE 4 MG PO TBPK
ORAL_TABLET | ORAL | 0 refills | Status: DC
  Filled 2024-04-08 – 2024-04-09 (×2): qty 21, 6d supply, fill #0

## 2024-04-08 NOTE — Progress Notes (Signed)
 Office Visit Note   Patient: Russell Cox           Date of Birth: Feb 28, 1951           MRN: [id number] Visit Date: 04/08/2024              Requested by: Ozell Heron HERO, MD [id number] [place],  [place] [address] PCP: Ozell Heron HERO, MD   Assessment & Plan: Visit Diagnoses:  1. Acute pain of right shoulder     Plan: Patient is a pleasant 73 year old gentleman wife seen in the past for low back pain.  About every 6 months he gets a flareup in his back and does well on Medrol  Dosepak.  Wondering if he could try that today he has had no injuries denies any paresthesias loss of bowel or bladder control and his exam is stable we will go forward with that.  Secondly he comes in today with a 1 month history of right shoulder pain from stretching it wrong.  Findings indicate most likely some rotator cuff irritation most of the pain with overhead reaching and internal rotation behind the back.  He is strength is intact.  We talked about again the Medrol  Dosepak might help this problem as well.  I gave him some rotator cuff exercises and showed him how to do these.  Can follow-up in a month or sooner if his symptoms warrant.  Could consider an subacromial injection if he still having problems with the shoulder.  Follow-Up Instructions: No follow-ups on file.   Orders:  Orders Placed This Encounter  Procedures   XR Shoulder Right   No orders of the defined types were placed in this encounter.     Procedures: No procedures performed   Clinical Data: No additional findings.   Subjective: No chief complaint on file.   HPI Patient is a pleasant 73 year old gentleman who have seen in the past for low back pain.  He said he has had a flareup of that low back pain wondering if he could have a Medrol  Dosepak.  Also complaining of right shoulder pain for 1 month.  He just stretched wrong no actual injury. Review of Systems  All other systems reviewed and are  negative.    Objective: Vital Signs: There were no vitals taken for this visit.  Physical Exam Constitutional:      Appearance: Normal appearance.  Skin:    General: Skin is warm and dry.  Neurological:     General: No focal deficit present.     Mental Status: He is alert and oriented to person, place, and time.  Psychiatric:        Mood and Affect: Mood normal.        Behavior: Behavior normal.     Ortho Exam Examination of his back he has good flexion extension he has negative straight leg raise good strength with resisted dorsiflexion plantarflexion of his ankles and extension and flexion of his legs.  Sensation is intact.  Right shoulder he has some stiffness and pain with forward elevation slightly decreased from unaffected side.  Painful with internal rotation behind the back very mild positive empty can test negative speeds test no pain with external rotation or stiffness.  Good grip strength good abductor strength good external and internal rotation strength Specialty Comments:  No specialty comments available.  Imaging: No results found.   PMFS History: Patient Active Problem List   Diagnosis Date Noted   COPD (chronic obstructive pulmonary  disease) (HCC) 04/26/2022   COVID-19 02/21/2022   Low back pain 04/05/2020   Impaired glucose tolerance 08/05/2014   History of colonic polyps 08/05/2014   Dyslipidemia 03/25/2011   Tobacco abuse 03/25/2011   Hypertension 12/05/2010   Past Medical History:  Diagnosis Date   Allergy    seasonal   Arthritis    a little bit,back,elbows.   Asthma    as a child   Atypical mole 03/26/2010   R Mid Lower Back - WS   Basal cell carcinoma 03/10/2006   L Side Nose - MOHs   Basal cell carcinoma (BCC) 03/26/2010   L Inferior Nose - MOHs   Cataract    removed bilaterally   Cigarette smoker    COPD (chronic obstructive pulmonary disease) (HCC)    Hyperlipidemia    Hypertension    SINUSITIS, ACUTE MAXILLARY 02/24/2007    URI 05/28/2007    Family History  Problem Relation Age of Onset   Cancer Mother        unknown what type   Colon cancer Mother 80   Arthritis Mother    Heart disease Father    Cancer Father        thyroid    Hypertension Sister    Hypertension Brother    Bladder Cancer Brother 39   Diabetes Maternal Grandmother    Cancer Maternal Grandfather        lung   Colon polyps Child    Esophageal cancer Neg Hx    Rectal cancer Neg Hx    Stomach cancer Neg Hx    Crohn's disease Neg Hx    Ulcerative colitis Neg Hx     Past Surgical History:  Procedure Laterality Date   CATARACT EXTRACTION W/ INTRAOCULAR LENS IMPLANT Bilateral    left   COLONOSCOPY  03/2016   repeat in 5 years   ELBOW SURGERY  1988   tennis   hemmorrhoidectomy  1988   POLYPECTOMY     SHOULDER ARTHROSCOPY W/ ACROMIAL REPAIR     left   Social History   Occupational History   Not on file  Tobacco Use   Smoking status: Every Day    Current packs/day: 1.00    Average packs/day: 1 pack/day for 54.9 years (54.9 ttl pk-yrs)    Types: Cigarettes    Start date: 65   Smokeless tobacco: Never   Tobacco comments:    DISCUSSED SMOKING CESSATION 07/10/2021  Vaping Use   Vaping status: Never Used  Substance and Sexual Activity   Alcohol use: Yes    Comment: socially   Drug use: No   Sexual activity: Not Currently

## 2024-04-09 ENCOUNTER — Other Ambulatory Visit (HOSPITAL_COMMUNITY): Payer: Self-pay

## 2024-04-12 ENCOUNTER — Encounter: Payer: Self-pay | Admitting: Radiology

## 2024-04-13 ENCOUNTER — Other Ambulatory Visit: Payer: Self-pay

## 2024-04-28 ENCOUNTER — Encounter: Payer: Self-pay | Admitting: Family Medicine

## 2024-04-28 ENCOUNTER — Other Ambulatory Visit (HOSPITAL_COMMUNITY): Payer: Self-pay

## 2024-04-28 ENCOUNTER — Ambulatory Visit: Payer: PPO | Admitting: Family Medicine

## 2024-04-28 ENCOUNTER — Other Ambulatory Visit: Payer: Self-pay

## 2024-04-28 VITALS — BP 120/62 | HR 80 | Temp 98.2°F | Ht 71.25 in | Wt 162.1 lb

## 2024-04-28 DIAGNOSIS — E785 Hyperlipidemia, unspecified: Secondary | ICD-10-CM | POA: Diagnosis not present

## 2024-04-28 DIAGNOSIS — R7302 Impaired glucose tolerance (oral): Secondary | ICD-10-CM | POA: Diagnosis not present

## 2024-04-28 DIAGNOSIS — Z125 Encounter for screening for malignant neoplasm of prostate: Secondary | ICD-10-CM

## 2024-04-28 DIAGNOSIS — I10 Essential (primary) hypertension: Secondary | ICD-10-CM | POA: Diagnosis not present

## 2024-04-28 DIAGNOSIS — J432 Centrilobular emphysema: Secondary | ICD-10-CM

## 2024-04-28 DIAGNOSIS — Z0000 Encounter for general adult medical examination without abnormal findings: Secondary | ICD-10-CM | POA: Diagnosis not present

## 2024-04-28 DIAGNOSIS — R5383 Other fatigue: Secondary | ICD-10-CM | POA: Diagnosis not present

## 2024-04-28 DIAGNOSIS — J310 Chronic rhinitis: Secondary | ICD-10-CM | POA: Diagnosis not present

## 2024-04-28 LAB — COMPREHENSIVE METABOLIC PANEL WITH GFR
ALT: 26 U/L (ref 0–53)
AST: 24 U/L (ref 0–37)
Albumin: 4.5 g/dL (ref 3.5–5.2)
Alkaline Phosphatase: 73 U/L (ref 39–117)
BUN: 29 mg/dL — ABNORMAL HIGH (ref 6–23)
CO2: 27 meq/L (ref 19–32)
Calcium: 9.4 mg/dL (ref 8.4–10.5)
Chloride: 100 meq/L (ref 96–112)
Creatinine, Ser: 1.23 mg/dL (ref 0.40–1.50)
GFR: 57.67 mL/min — ABNORMAL LOW (ref >60.00)
Glucose, Bld: 108 mg/dL — ABNORMAL HIGH (ref 70–99)
Potassium: 4.4 meq/L (ref 3.5–5.1)
Sodium: 136 meq/L (ref 135–145)
Total Bilirubin: 0.6 mg/dL (ref 0.2–1.2)
Total Protein: 7.4 g/dL (ref 6.0–8.3)

## 2024-04-28 LAB — LIPID PANEL
Cholesterol: 174 mg/dL (ref 0–200)
HDL: 73.6 mg/dL (ref >39.00)
LDL Cholesterol: 80 mg/dL (ref 0–99)
NonHDL: 100.22
Total CHOL/HDL Ratio: 2
Triglycerides: 102 mg/dL (ref 0.0–149.0)
VLDL: 20.4 mg/dL (ref 0.0–40.0)

## 2024-04-28 LAB — PSA, MEDICARE: PSA: 0.23 ng/mL (ref 0.10–4.00)

## 2024-04-28 LAB — TSH: TSH: 3.19 u[IU]/mL (ref 0.35–5.50)

## 2024-04-28 MED ORDER — ATORVASTATIN CALCIUM 40 MG PO TABS
40.0000 mg | ORAL_TABLET | Freq: Every day | ORAL | 1 refills | Status: AC
  Filled 2024-04-28 – 2024-06-11 (×2): qty 90, 90d supply, fill #0

## 2024-04-28 MED ORDER — LOSARTAN POTASSIUM-HCTZ 100-25 MG PO TABS
1.0000 | ORAL_TABLET | Freq: Every day | ORAL | 1 refills | Status: AC
  Filled 2024-04-28 – 2024-06-11 (×2): qty 90, 90d supply, fill #0

## 2024-04-28 MED ORDER — FLUTICASONE-SALMETEROL 115-21 MCG/ACT IN AERO
2.0000 | INHALATION_SPRAY | Freq: Two times a day (BID) | RESPIRATORY_TRACT | 5 refills | Status: AC
  Filled 2024-04-28: qty 12, 30d supply, fill #0

## 2024-04-28 MED ORDER — LEVOCETIRIZINE DIHYDROCHLORIDE 5 MG PO TABS
5.0000 mg | ORAL_TABLET | Freq: Every evening | ORAL | 3 refills | Status: AC
  Filled 2024-04-28: qty 90, 90d supply, fill #0

## 2024-04-28 NOTE — Progress Notes (Signed)
 Complete physical exam  Patient: Russell Cox   DOB: June 02, 1951   73 y.o. Male  MRN: [id number]  Subjective:    Chief Complaint  Patient presents with   Medicare Wellness    Russell Cox is a 73 y.o. male who presents today for a complete physical exam. He reports consuming a general diet. Eating fruits and veggies often, eating protein daily, eats dairy also. Home exercise routine includes walking 1-2 hrs per week. He generally feels well. He reports sleeping well. He does not have additional problems to discuss today.    Most recent fall risk assessment:    04/28/2024    9:39 AM  Fall Risk   Falls in the past year? 0  Number falls in past yr: 0  Injury with Fall? 0  Risk for fall due to : No Fall Risks  Follow up Falls evaluation completed     Most recent depression screenings:    04/28/2024    9:41 AM 08/01/2023   10:11 AM  PHQ 2/9 Scores  PHQ - 2 Score 0 0    Vision:Within last year and Dental: No current dental problems and Receives regular dental care  Patient Active Problem List   Diagnosis Date Noted   COPD (chronic obstructive pulmonary disease) (HCC) 04/26/2022   COVID-19 02/21/2022   Low back pain 04/05/2020   Impaired glucose tolerance 08/05/2014   History of colonic polyps 08/05/2014   Dyslipidemia 03/25/2011   Tobacco abuse 03/25/2011   Hypertension 12/05/2010      Patient Care Team: Ozell Heron HERO, MD as PCP - General (Family Medicine) Livingston Rigg, MD as Consulting Physician (Dermatology)   Outpatient Medications Prior to Visit  Medication Sig   aspirin  81 MG tablet Take 1 tablet (81 mg total) daily by mouth.   EPINEPHrine  0.3 mg/0.3 mL IJ SOAJ injection Inject 0.3 mg into the muscle as needed for anaphylaxis.   fluticasone  (FLONASE ) 50 MCG/ACT nasal spray Place 1 spray into both nostrils 2 (two) times daily.   guaiFENesin  (MUCINEX ) 600 MG 12 hr tablet Take 1 tablet (600 mg total) by mouth 2 (two) times daily as needed for cough or to  loosen phlegm.   Multiple Vitamin (MULTIVITAMIN ADULT PO) Take by mouth.   sodium chloride  (OCEAN) 0.65 % SOLN nasal spray Place 1 spray into both nostrils as needed for congestion.   [DISCONTINUED] atorvastatin  (LIPITOR) 40 MG tablet Take 1 tablet (40 mg total) by mouth daily.   [DISCONTINUED] losartan -hydrochlorothiazide  (HYZAAR ) 100-25 MG tablet Take 1 tablet by mouth daily.   [DISCONTINUED] cefdinir  (OMNICEF ) 300 MG capsule Take 1 capsule (300 mg total) by mouth 2 (two) times daily. (Patient not taking: Reported on 11/07/2023)   [DISCONTINUED] cetirizine  (ZYRTEC ) 10 MG tablet Take 1 tablet (10 mg total) by mouth at bedtime.   [DISCONTINUED] doxycycline  (VIBRAMYCIN ) 100 MG capsule Take 1 capsule (100 mg total) by mouth 2 (two) times daily. (Patient not taking: Reported on 11/07/2023)   [DISCONTINUED] methylPREDNISolone  (MEDROL  DOSEPAK) 4 MG TBPK tablet Take as directed with food   [DISCONTINUED] predniSONE  (DELTASONE ) 20 MG tablet Take two tablets by mouth once daily for 6 days (Patient not taking: Reported on 11/07/2023)   No facility-administered medications prior to visit.    Review of Systems  HENT:  Negative for hearing loss.   Eyes:  Negative for blurred vision.  Respiratory:  Negative for shortness of breath.   Cardiovascular:  Negative for chest pain.  Gastrointestinal: Negative.   Genitourinary: Negative.  Musculoskeletal:  Negative for back pain.  Neurological:  Negative for headaches.  Psychiatric/Behavioral:  Negative for depression.        Objective:     BP 120/62   Pulse 80   Temp 98.2 F (36.8 C) (Oral)   Ht 5' 11.25 (1.81 m)   Wt 162 lb 1.6 oz (73.5 kg)   SpO2 98%   BMI 22.45 kg/m    Physical Exam Vitals reviewed.  Constitutional:      Appearance: Normal appearance. He is well-groomed and normal weight.  HENT:     Right Ear: Tympanic membrane and ear canal normal.     Left Ear: Tympanic membrane and ear canal normal.     Mouth/Throat:     Mouth:  Mucous membranes are moist.     Pharynx: No posterior oropharyngeal erythema.  Eyes:     Extraocular Movements: Extraocular movements intact.     Conjunctiva/sclera: Conjunctivae normal.  Neck:     Thyroid : No thyromegaly.  Cardiovascular:     Rate and Rhythm: Normal rate and regular rhythm.     Heart sounds: S1 normal and S2 normal. No murmur heard. Pulmonary:     Effort: Pulmonary effort is normal.     Breath sounds: Normal breath sounds and air entry. No rales.  Abdominal:     General: Abdomen is flat. Bowel sounds are normal.  Musculoskeletal:     Right lower leg: No edema.     Left lower leg: No edema.  Lymphadenopathy:     Cervical: No cervical adenopathy.  Neurological:     General: No focal deficit present.     Mental Status: He is alert and oriented to person, place, and time.     Gait: Gait is intact.  Psychiatric:        Mood and Affect: Mood and affect normal.      No results found for any visits on 04/28/24.     Assessment & Plan:    Routine Health Maintenance and Physical Exam  Immunization History  Administered Date(s) Administered   Fluad Quad(high Dose 65+) 03/03/2019, 03/15/2020, 04/04/2021, 04/26/2022   INFLUENZA, HIGH DOSE SEASONAL PF 04/15/2017, 04/16/2023   Influenza Split 03/25/2011, 04/20/2012   Influenza Whole 05/28/2007   Influenza, Seasonal, Injecte, Preservative Fre 04/12/2015   Influenza,inj,Quad PF,6+ Mos 04/06/2013   Influenza-Unspecified 03/10/2014, 04/21/2023   PFIZER(Purple Top)SARS-COV-2 Vaccination 07/23/2019, 08/14/2019, 04/11/2020   PNEUMOCOCCAL CONJUGATE-20 08/06/2021   Pfizer Covid-19 Vaccine Bivalent Booster 19yrs & up 04/04/2021   Pneumococcal Conjugate-13 08/05/2014   Pneumococcal Polysaccharide-23 04/15/2017   Tdap 03/25/2011   Zoster Recombinant(Shingrix) 03/17/2018, 07/27/2018, 12/08/2018    Health Maintenance  Topic Date Due   COVID-19 Vaccine (5 - 2025-26 season) 02/09/2024   Influenza Vaccine  09/07/2024  (Originally 01/09/2024)   Medicare Annual Wellness (AWV)  07/31/2024   Lung Cancer Screening  12/08/2024   Colonoscopy  08/20/2025   Pneumococcal Vaccine: 50+ Years  Completed   Hepatitis C Screening  Completed   Zoster Vaccines- Shingrix  Completed   Meningococcal B Vaccine  Aged Out   DTaP/Tdap/Td  Discontinued    Discussed health benefits of physical activity, and encouraged him to engage in regular exercise appropriate for his age and condition.  Impaired glucose tolerance -     Hemoglobin A1c; Future  Dyslipidemia -     Lipid panel; Future -     Atorvastatin  Calcium ; Take 1 tablet (40 mg total) by mouth daily.  Dispense: 90 tablet; Refill: 1  Essential hypertension -  Losartan  Potassium-HCTZ; Take 1 tablet by mouth daily.  Dispense: 90 tablet; Refill: 1 -     Comprehensive metabolic panel with GFR  Routine adult health maintenance  Centrilobular emphysema (HCC) -     Fluticasone -Salmeterol; Inhale 2 puffs into the lungs 2 (two) times daily.  Dispense: 1 each; Refill: 5  Chronic rhinitis -     Levocetirizine Dihydrochloride ; Take 1 tablet (5 mg total) by mouth every evening.  Dispense: 90 tablet; Refill: 3  Prostate cancer screening -     PSA, Medicare; Future  Other fatigue -     TSH; Future  General physical exam findings are normal today. I reviewed the patient's preventative testing, immunizations, and lifestyle habits. I made appropriate recommendations and placed orders for the appropriate tests and/or vaccinations. I counseled the patient on the CDC's recommendations for healthy exercise and diet. I counseled the patient on healthy sleep habits and stress management. Handouts to reinforce the counseling were given at the conclusion of the visit.    Return in about 1 year (around 04/28/2025) for annual physical exam.     Heron CHRISTELLA Sharper, MD

## 2024-04-29 LAB — HEMOGLOBIN A1C: Hgb A1c MFr Bld: 5.9 % (ref 4.6–6.5)

## 2024-05-04 ENCOUNTER — Ambulatory Visit: Payer: Self-pay | Admitting: Family Medicine

## 2024-06-11 ENCOUNTER — Other Ambulatory Visit: Payer: Self-pay

## 2024-06-11 ENCOUNTER — Other Ambulatory Visit (HOSPITAL_COMMUNITY): Payer: Self-pay

## 2024-08-06 ENCOUNTER — Ambulatory Visit: Payer: PPO
